# Patient Record
Sex: Female | Born: 1935 | Race: White | Hispanic: No | Smoking: Never smoker
Health system: Southern US, Community
[De-identification: ages and names within clinical notes are randomized; demographics above are authoritative.]

## PROBLEM LIST (undated history)

## (undated) DIAGNOSIS — I4891 Unspecified atrial fibrillation: Secondary | ICD-10-CM

## (undated) DIAGNOSIS — F0390 Unspecified dementia without behavioral disturbance: Secondary | ICD-10-CM

## (undated) DIAGNOSIS — K219 Gastro-esophageal reflux disease without esophagitis: Secondary | ICD-10-CM

---

## 2018-02-26 ENCOUNTER — Ambulatory Visit (INDEPENDENT_AMBULATORY_CARE_PROVIDER_SITE_OTHER): Payer: Medicare Other | Admitting: Podiatry

## 2018-02-26 ENCOUNTER — Encounter: Payer: Self-pay | Admitting: Podiatry

## 2018-02-26 VITALS — BP 150/79 | HR 83

## 2018-02-26 DIAGNOSIS — M79674 Pain in right toe(s): Secondary | ICD-10-CM | POA: Diagnosis not present

## 2018-02-26 DIAGNOSIS — M79675 Pain in left toe(s): Secondary | ICD-10-CM | POA: Diagnosis not present

## 2018-02-26 DIAGNOSIS — B351 Tinea unguium: Secondary | ICD-10-CM | POA: Diagnosis not present

## 2018-02-26 DIAGNOSIS — D689 Coagulation defect, unspecified: Secondary | ICD-10-CM

## 2018-02-26 NOTE — Progress Notes (Signed)
Complaint:  Visit Type: Patient returns to my office for continued preventative foot care services. Complaint: Patient states" my nails have grown long and thick and become painful to walk and wear shoes" Patient is taking eliquiss.. The patient presents for preventative foot care services. No changes to ROS  Podiatric Exam: Vascular: dorsalis pedis are palpable bilateral. Posterior tibial pulses are absent  B/l. Capillary return is immediate. Temperature gradient is WNL. Skin turgor WNL  Sensorium: Normal Semmes Weinstein monofilament test. Normal tactile sensation bilaterally. Nail Exam: Pt has thick disfigured discolored nails with subungual debris noted bilateral entire nail hallux through fifth toenails Ulcer Exam: There is no evidence of ulcer or pre-ulcerative changes or infection. Orthopedic Exam: Muscle tone and strength are WNL. No limitations in general ROM. No crepitus or effusions noted. Foot type and digits show no abnormalities. Bony prominences are unremarkable. Skin: No Porokeratosis. No infection or ulcers  Diagnosis:  Onychomycosis, , Pain in right toe, pain in left toes  Treatment & Plan Procedures and Treatment: Consent by patient was obtained for treatment procedures.   Debridement of mycotic and hypertrophic toenails, 1 through 5 bilateral and clearing of subungual debris. No ulceration, no infection noted.  Return Visit-Office Procedure: Patient instructed to return to the office for a follow up visit 3 months for continued evaluation and treatment.    Helane GuntherGregory Angelina Neece DPM

## 2018-04-13 ENCOUNTER — Ambulatory Visit: Payer: Self-pay | Admitting: Urology

## 2018-04-27 ENCOUNTER — Ambulatory Visit: Payer: Self-pay | Admitting: Urology

## 2018-05-28 ENCOUNTER — Ambulatory Visit (INDEPENDENT_AMBULATORY_CARE_PROVIDER_SITE_OTHER): Payer: Medicare Other | Admitting: Podiatry

## 2018-05-28 ENCOUNTER — Encounter: Payer: Self-pay | Admitting: Podiatry

## 2018-05-28 DIAGNOSIS — M79674 Pain in right toe(s): Secondary | ICD-10-CM | POA: Diagnosis not present

## 2018-05-28 DIAGNOSIS — M79675 Pain in left toe(s): Secondary | ICD-10-CM

## 2018-05-28 DIAGNOSIS — B351 Tinea unguium: Secondary | ICD-10-CM

## 2018-05-28 DIAGNOSIS — D689 Coagulation defect, unspecified: Secondary | ICD-10-CM

## 2018-05-28 NOTE — Progress Notes (Signed)
Complaint:  Visit Type: Patient returns to my office for continued preventative foot care services. Complaint: Patient states" my nails have grown long and thick and become painful to walk and wear shoes" Patient is taking eliquiss.. The patient presents for preventative foot care services. No changes to ROS.  Patient is taking eliquiss.  Podiatric Exam: Vascular: dorsalis pedis are palpable bilateral. Posterior tibial pulses are absent  B/l. Capillary return is immediate. Temperature gradient is WNL. Skin turgor WNL  Sensorium: Normal Semmes Weinstein monofilament test. Normal tactile sensation bilaterally. Nail Exam: Pt has thick disfigured discolored nails with subungual debris noted bilateral entire nail hallux through fifth toenails Ulcer Exam: There is no evidence of ulcer or pre-ulcerative changes or infection. Orthopedic Exam: Muscle tone and strength are WNL. No limitations in general ROM. No crepitus or effusions noted. Foot type and digits show no abnormalities. Bony prominences are unremarkable. Skin: No Porokeratosis. No infection or ulcers  Diagnosis:  Onychomycosis, , Pain in right toe, pain in left toes  Treatment & Plan Procedures and Treatment: Consent by patient was obtained for treatment procedures.   Debridement of mycotic and hypertrophic toenails, 1 through 5 bilateral and clearing of subungual debris. No ulceration, no infection noted.  Return Visit-Office Procedure: Patient instructed to return to the office for a follow up visit 3 months for continued evaluation and treatment.    Helane Gunther DPM

## 2018-08-27 ENCOUNTER — Ambulatory Visit: Payer: Medicare Other | Admitting: Podiatry

## 2018-09-26 ENCOUNTER — Other Ambulatory Visit: Payer: Self-pay

## 2018-09-26 ENCOUNTER — Emergency Department
Admission: EM | Admit: 2018-09-26 | Discharge: 2018-09-26 | Disposition: A | Discharge status: Home / Self Care | Payer: Medicare Other | Attending: Emergency Medicine | Admitting: Emergency Medicine

## 2018-09-26 ENCOUNTER — Encounter: Payer: Self-pay | Admitting: Emergency Medicine

## 2018-09-26 ENCOUNTER — Emergency Department: Payer: Medicare Other

## 2018-09-26 DIAGNOSIS — N3000 Acute cystitis without hematuria: Secondary | ICD-10-CM | POA: Insufficient documentation

## 2018-09-26 DIAGNOSIS — F0390 Unspecified dementia without behavioral disturbance: Secondary | ICD-10-CM | POA: Diagnosis not present

## 2018-09-26 DIAGNOSIS — R55 Syncope and collapse: Secondary | ICD-10-CM | POA: Diagnosis present

## 2018-09-26 DIAGNOSIS — Z79899 Other long term (current) drug therapy: Secondary | ICD-10-CM | POA: Insufficient documentation

## 2018-09-26 DIAGNOSIS — I951 Orthostatic hypotension: Secondary | ICD-10-CM | POA: Diagnosis not present

## 2018-09-26 HISTORY — DX: Unspecified dementia, unspecified severity, without behavioral disturbance, psychotic disturbance, mood disturbance, and anxiety: F03.90

## 2018-09-26 LAB — URINALYSIS, COMPLETE (UACMP) WITH MICROSCOPIC
Bilirubin Urine: NEGATIVE
Glucose, UA: NEGATIVE mg/dL
Hgb urine dipstick: NEGATIVE
Ketones, ur: NEGATIVE mg/dL
Nitrite: NEGATIVE
Protein, ur: NEGATIVE mg/dL
Specific Gravity, Urine: 1.011 (ref 1.005–1.030)
pH: 7 (ref 5.0–8.0)

## 2018-09-26 LAB — CBC
HCT: 47.2 % — ABNORMAL HIGH (ref 36.0–46.0)
Hemoglobin: 14.8 g/dL (ref 12.0–15.0)
MCH: 29.4 pg (ref 26.0–34.0)
MCHC: 31.4 g/dL (ref 30.0–36.0)
MCV: 93.7 fL (ref 80.0–100.0)
Platelets: 219 10*3/uL (ref 150–400)
RBC: 5.04 MIL/uL (ref 3.87–5.11)
RDW: 13.4 % (ref 11.5–15.5)
WBC: 6.7 10*3/uL (ref 4.0–10.5)
nRBC: 0 % (ref 0.0–0.2)

## 2018-09-26 LAB — BASIC METABOLIC PANEL
Anion gap: 12 (ref 5–15)
BUN: 21 mg/dL (ref 8–23)
CO2: 19 mmol/L — ABNORMAL LOW (ref 22–32)
Calcium: 9.3 mg/dL (ref 8.9–10.3)
Chloride: 102 mmol/L (ref 98–111)
Creatinine, Ser: 0.77 mg/dL (ref 0.44–1.00)
GFR calc Af Amer: >60 mL/min (ref >60)
GFR calc non Af Amer: >60 mL/min (ref >60)
Glucose, Bld: 191 mg/dL — ABNORMAL HIGH (ref 70–99)
Potassium: 4.5 mmol/L (ref 3.5–5.1)
Sodium: 133 mmol/L — ABNORMAL LOW (ref 135–145)

## 2018-09-26 MED ORDER — SODIUM CHLORIDE 0.9% FLUSH: 3.0000 mL | Freq: Once | INTRAVENOUS | Status: DC

## 2018-09-26 MED ORDER — FOSFOMYCIN TROMETHAMINE 3 G PO PACK
3.0000 g | PACK | Freq: Once | ORAL | Status: AC
  Administered 2018-09-26: 3 g via ORAL
  Filled 2018-09-26: qty 3

## 2018-09-26 MED ORDER — SODIUM CHLORIDE 0.9 % IV BOLUS
1000.0000 mL | Freq: Once | INTRAVENOUS | Status: AC
  Administered 2018-09-26: 1000 mL via INTRAVENOUS

## 2018-09-26 NOTE — ED Triage Notes (Signed)
Husband reports patient had a syncopal episode in the kitchen this morning, hit head and injured left arm on cabinet.

## 2018-09-26 NOTE — ED Notes (Signed)
PT spouse verbalizes d/c understanding, follow up and change in meds. Pt in NAD, VSS. PT spouse unable to sign due to signature pad malfrx

## 2018-09-26 NOTE — ED Provider Notes (Addendum)
[facility] Emergency Department Provider Note  ____________________________________________   First MD Initiated Contact with Patient 09/26/18 1226     (approximate)  I have reviewed the triage vital signs and the nursing notes.   HISTORY  Chief Complaint Loss of Consciousness    HPI Gabrielle ArenaSarah Navarro is a 85 y.o. female  With h/o dementia here with LOC. History provided by husband due to dementia.  Per his report, pt was in her usual state of health until this morning. Pt was eating, stood up, started walking then passed out. She sat down more so than fell. Husband took BP and it was 104/60 with HR 45.  That 1 of her medications recently changed, and he thinks this was a beta-blocker.  Her heart rate was in the 60s this morning, which is low for her.  She has passed out in the setting of similar episodes in the past.  She is now back to her baseline.  Per husband's report, she has not been ill or unwell.  Her mental status is at her usual.  H/o AFib on Eliquis, BP is usually normal. No recent med changes. H/o similar episodes of syncope in setting of standing up.         Past Medical History:  Diagnosis Date   Dementia (HCC)     There are no active problems to display for this patient.   No past surgical history on file.  Prior to Admission medications   Medication Sig Start Date End Date Taking? Authorizing Provider  fluticasone (FLONASE) 50 MCG/ACT nasal spray Place 2 sprays into the nose daily. 06/07/18 06/07/19 Yes [provider]  losartan (COZAAR) 50 MG tablet Take 50 mg by mouth 2 (two) times a day. 07/02/18  Yes [provider]  memantine (NAMENDA) 5 MG tablet Take 5 mg by mouth 2 (two) times a day. 07/02/18  Yes [provider]  QUEtiapine (SEROQUEL) 25 MG tablet Take 25 mg by mouth every evening. 07/20/18 09/26/18 Yes [provider]  apixaban (ELIQUIS) 2.5 MG TABS tablet Take 2.5 mg by mouth 2 (two) times daily.   05/04/18   [provider]  carvedilol (COREG) 6.25 MG tablet Take 6.25 mg by mouth 2 (two) times daily with a meal.  01/01/18   [provider]  Cyanocobalamin (B-12) 1000 MCG CAPS Take by mouth.    [provider]  levothyroxine (SYNTHROID, LEVOTHROID) 25 MCG tablet Take 25 mcg by mouth daily before breakfast.  02/09/16   [provider]  omeprazole (PRILOSEC) 10 MG capsule Take 10 mg by mouth daily.  04/07/18   [provider]  ranitidine (ZANTAC) 300 MG tablet Take 300 mg by mouth at bedtime.    [provider]  traZODone (DESYREL) 50 MG tablet Take 50 mg by mouth at bedtime.  12/11/17   [provider]    Allergies Amoxicillin, Shellfish allergy, Chocolate flavor, and Sulfa antibiotics  No family history on file.  Social History Social History   Tobacco Use   Smoking status: Never Smoker   Smokeless tobacco: Never Used  Substance Use Topics   Alcohol use: Never    Frequency: Never   Drug use: Never    Review of Systems  Review of Systems  Unable to perform ROS: Dementia  Constitutional: Positive for fatigue. Negative for fever.  HENT: Negative for congestion and sore throat.   Eyes: Negative for visual disturbance.  Respiratory: Negative for cough and shortness of breath.   Cardiovascular:  Negative for chest pain.  Gastrointestinal: Negative for abdominal pain, diarrhea, nausea and vomiting.  Genitourinary: Negative for flank pain.  Musculoskeletal: Negative for back pain and neck pain.  Skin: Negative for rash and wound.  Neurological: Positive for syncope and weakness.  All other systems reviewed and are negative.    ____________________________________________  PHYSICAL EXAM:      VITAL SIGNS: ED Triage Vitals [09/26/18 1157]  Enc Vitals Group     BP (!) 142/84     Pulse Rate 82     Resp 16     Temp 97.7 F (36.5 C)     Temp Source Oral     SpO2 97 %     Weight 115 lb (52.2 kg)     Height       Head Circumference      Peak Flow      Pain Score      Pain Loc      Pain Edu?      Excl. in [place]?      Physical Exam Vitals signs and nursing note reviewed.  Constitutional:      General: She is not in acute distress.    Appearance: She is well-developed.  HENT:     Head: Normocephalic and atraumatic.  Eyes:     Conjunctiva/sclera: Conjunctivae normal.  Neck:     Musculoskeletal: Neck supple.  Cardiovascular:     Rate and Rhythm: Normal rate and regular rhythm.     Heart sounds: Normal heart sounds. No murmur. No friction rub.  Pulmonary:     Effort: Pulmonary effort is normal. No respiratory distress.     Breath sounds: Normal breath sounds. No wheezing or rales.  Abdominal:     General: There is no distension.     Palpations: Abdomen is soft.     Tenderness: There is no abdominal tenderness.  Skin:    General: Skin is warm.     Capillary Refill: Capillary refill takes less than 2 seconds.  Neurological:     Mental Status: She is alert and oriented to person, place, and time.     Motor: No abnormal muscle tone.       ____________________________________________   LABS (all labs ordered are listed, but only abnormal results are displayed)  Labs Reviewed  BASIC METABOLIC PANEL - Abnormal; Notable for the following components:      Result Value   Sodium 133 (*)    CO2 19 (*)    Glucose, Bld 191 (*)    All other components within normal limits  CBC - Abnormal; Notable for the following components:   HCT 47.2 (*)    All other components within normal limits  URINALYSIS, COMPLETE (UACMP) WITH MICROSCOPIC - Abnormal; Notable for the following components:   Color, Urine YELLOW (*)    APPearance CLEAR (*)    Leukocytes,Ua MODERATE (*)    Bacteria, UA RARE (*)    All other components within normal limits  URINE CULTURE  CBG MONITORING, ED    ____________________________________________  EKG: Atrial fibrillation, ventricular rate 80.  QRS 73, QTc 426.  No  apparent acute ischemic changes. ________________________________________  RADIOLOGY All imaging, including plain films, CT scans, and ultrasounds, independently reviewed by me, and interpretations confirmed via formal radiology reads.  ED MD interpretation:   Chest x-ray: No acute abnormality CT head: No acute intracranial abnormality CT cervical spine: No acute abnormality  Official radiology report(s): Dg Chest 2 View  Result Date: 09/26/2018 CLINICAL DATA:  Syncope this morning. EXAM: CHEST - 2 VIEW COMPARISON:  None. FINDINGS: The lungs are clear. There is marked cardiomegaly. No pneumothorax or pleural effusion. Atherosclerosis noted. No acute or focal bony abnormality. IMPRESSION: No acute disease. Cardiomegaly. Atherosclerosis. Electronically Signed   By: Drusilla Kannerhomas  Dalessio M.D.   On: 09/26/2018 13:06   Ct Head Wo Contrast  Result Date: 09/26/2018 CLINICAL DATA:  Syncope, head injury after fall. EXAM: CT HEAD WITHOUT CONTRAST CT CERVICAL SPINE WITHOUT CONTRAST TECHNIQUE: Multidetector CT imaging of the head and cervical spine was performed following the standard protocol without intravenous contrast. Multiplanar CT image reconstructions of the cervical spine were also generated. COMPARISON:  None. FINDINGS: CT HEAD FINDINGS Brain: Left occipital encephalomalacia is noted consistent with old infarction. Mild chronic ischemic white matter disease is noted. Old right cerebellar infarction is noted. No mass effect or midline shift is noted. Ventricular size is within normal limits. There is no evidence of mass lesion, hemorrhage or acute infarction. Vascular: No hyperdense vessel or unexpected calcification. Skull: Normal. Negative for fracture or focal lesion. Sinuses/Orbits: No acute finding. Other: None. CT CERVICAL SPINE FINDINGS Alignment: Minimal retrolisthesis of C5-6 is noted secondary to moderate degenerative disc disease at this level. Skull base and vertebrae: No acute fracture. No  primary bone lesion or focal pathologic process. Soft tissues and spinal canal: No prevertebral fluid or swelling. No visible canal hematoma. Disc levels: Moderate degenerative disc disease is noted at C3-4, C4-5 and C5-6. Upper chest: Negative. Other: Degenerative changes are seen involving posterior facet joints bilaterally. IMPRESSION: Old left occipital infarction. No acute intracranial abnormality seen. Multilevel degenerative disc disease. No acute abnormality seen in the cervical spine. Electronically Signed   By: Lupita RaiderJames  Green Jr M.D.   On: 09/26/2018 13:24   Ct Cervical Spine Wo Contrast  Result Date: 09/26/2018 CLINICAL DATA:  Syncope, head injury after fall. EXAM: CT HEAD WITHOUT CONTRAST CT CERVICAL SPINE WITHOUT CONTRAST TECHNIQUE: Multidetector CT imaging of the head and cervical spine was performed following the standard protocol without intravenous contrast. Multiplanar CT image reconstructions of the cervical spine were also generated. COMPARISON:  None. FINDINGS: CT HEAD FINDINGS Brain: Left occipital encephalomalacia is noted consistent with old infarction. Mild chronic ischemic white matter disease is noted. Old right cerebellar infarction is noted. No mass effect or midline shift is noted. Ventricular size is within normal limits. There is no evidence of mass lesion, hemorrhage or acute infarction. Vascular: No hyperdense vessel or unexpected calcification. Skull: Normal. Negative for fracture or focal lesion. Sinuses/Orbits: No acute finding. Other: None. CT CERVICAL SPINE FINDINGS Alignment: Minimal retrolisthesis of C5-6 is noted secondary to moderate degenerative disc disease at this level. Skull base and vertebrae: No acute fracture. No primary bone lesion or focal pathologic process. Soft tissues and spinal canal: No prevertebral fluid or swelling. No visible canal hematoma. Disc levels: Moderate degenerative disc disease is noted at C3-4, C4-5 and C5-6. Upper chest: Negative. Other:  Degenerative changes are seen involving posterior facet joints bilaterally. IMPRESSION: Old left occipital infarction. No acute intracranial abnormality seen. Multilevel degenerative disc disease. No acute abnormality seen in the cervical spine. Electronically Signed   By: Lupita RaiderJames  Green Jr M.D.   On: 09/26/2018 13:24    ____________________________________________  PROCEDURES   Procedure(s) performed (including Critical Care):  Procedures  ____________________________________________  INITIAL IMPRESSION / MDM / ASSESSMENT AND PLAN / ED COURSE  As part of my medical decision making, I reviewed the following data within the electronic MEDICAL RECORD NUMBER Notes from  prior ED visits and [place] Controlled Substance Database      *Gabrielle ArenaSarah Navarro was evaluated in Emergency Department on 09/26/2018 for the symptoms described in the history of present illness. She was evaluated in the context of the global COVID-19 pandemic, which necessitated consideration that the patient might be at risk for infection with the SARS-CoV-2 virus that causes COVID-19. Institutional protocols and algorithms that pertain to the evaluation of patients at risk for COVID-19 are in a state of rapid change based on information released by regulatory bodies including the CDC and federal and state organizations. These policies and algorithms were followed during the patient's care in the ED.  Some ED evaluations and interventions may be delayed as a result of limited staffing during the pandemic.*   Clinical Course as of Sep 25 1505  Wed Sep 26, 2018  1416 Discussed plan with daughter, [name], who lives nearby. Also discussed plan at length with husband.   [CI]    Clinical Course User Index [CI] Shaune PollackIsaacs, Henley Boettner, MD    Medical Decision Making: 85 year old female here with syncope.  This is a chronic, recurring issue for which she has seen cardiology and neurology.  Today, the seem to be correlated with standing up, and per her  report and husband's report, this seems to have gotten worse after starting Seroquel nightly.  She also took her carvedilol prior to eating.  I suspect orthostasis secondary to combination of age, Seroquel, and likely lack of appropriate compensatory response due to beta-blockade.  She is not anemic here.  She appears mildly dehydrated and has been given fluids but lab work is otherwise reassuring.  EKG is nonischemic without arrhythmia.  Her heart rate is now in the 70s to 80s, which is her baseline.  She is been ambulatory without difficulty.  CT and plain films are negative.  I had a long discussion with the husband as well as her daughter.  Given her age and dementia, they would like to attempt outpatient management which I think is very reasonable.  Will have her hold her Seroquel temporarily, also have her dose of Coreg for the next several days to see if this improves her symptoms.  Would recommend calling her cardiologist and PCP for seen in the morning.  Daughter understands and is in agreement.  Of note, urinalysis incidentally shows possible UTI.  This is a chronic issue for her.  Given that she is otherwise well-appearing, the low concern for systemic illness due to this.  Will give a dose of fosfomycin given her complex history, but do not feel this is complicated anyway.  No signs upon nephritis.  Urine culture sent.  ____________________________________________  FINAL CLINICAL IMPRESSION(S) / ED DIAGNOSES  Final diagnoses:  Orthostasis  Polypharmacy  Acute cystitis without hematuria     MEDICATIONS GIVEN DURING THIS VISIT:  Medications  sodium chloride flush (NS) 0.9 % injection 3 mL (has no administration in time range)  fosfomycin (MONUROL) packet 3 g (has no administration in time range)  sodium chloride 0.9 % bolus 1,000 mL (1,000 mLs Intravenous New Bag/Given 09/26/18 1321)     ED Discharge Orders    None       Note:  This document was prepared using Dragon voice  recognition software and may include unintentional dictation errors.   Shaune PollackIsaacs, Vernona Peake, MD 09/26/18 1450    Shaune PollackIsaacs, Crawford Tamura, MD 09/26/18 Chyrel [id number]    Shaune PollackIsaacs, Saryah Loper, MD 09/26/18 [phone]

## 2018-09-26 NOTE — ED Notes (Signed)
Patient transported to CT 

## 2018-09-26 NOTE — Discharge Instructions (Addendum)
I suspect your symptoms today are likely due to medication effects in addition to mild dehydration.  You have been given fluids for her dehydration.  Make sure you drink plenty of fluids at home.  For now, we will recommend the following medication changes: - STOP taking the Seroquel at night, as this can cause drop in blood pressure. Call Dr. Manuella Ghazi to discuss. - FOR THE NEXT 2-3 DAYS, only take HALF of your Carvedilol dose. Instead of 6.25 mg twice daily, take one half of this dose for a total of 3.125 mg twice daily. If your blood pressure and heart rate are high, you can resume your normal dose but I'd recommend first increasing the night dose to the full 6.25 mg for one or two days, then going back to your old dose.  - Drink plenty of fluids and try to eat frequent small meals

## 2019-09-06 ENCOUNTER — Other Ambulatory Visit: Payer: Self-pay

## 2019-09-06 ENCOUNTER — Emergency Department: Payer: Medicare Other

## 2019-09-06 ENCOUNTER — Observation Stay
Admission: EM | Admit: 2019-09-06 | Discharge: 2019-09-07 | Disposition: A | Discharge status: Skilled Nursing Facility | Payer: Medicare Other | Attending: Internal Medicine | Admitting: Internal Medicine

## 2019-09-06 DIAGNOSIS — E039 Hypothyroidism, unspecified: Secondary | ICD-10-CM | POA: Diagnosis not present

## 2019-09-06 DIAGNOSIS — Z7901 Long term (current) use of anticoagulants: Secondary | ICD-10-CM | POA: Diagnosis not present

## 2019-09-06 DIAGNOSIS — F0390 Unspecified dementia without behavioral disturbance: Secondary | ICD-10-CM | POA: Diagnosis not present

## 2019-09-06 DIAGNOSIS — Z8673 Personal history of transient ischemic attack (TIA), and cerebral infarction without residual deficits: Secondary | ICD-10-CM | POA: Insufficient documentation

## 2019-09-06 DIAGNOSIS — I10 Essential (primary) hypertension: Secondary | ICD-10-CM | POA: Diagnosis not present

## 2019-09-06 DIAGNOSIS — R55 Syncope and collapse: Principal | ICD-10-CM | POA: Insufficient documentation

## 2019-09-06 DIAGNOSIS — H9190 Unspecified hearing loss, unspecified ear: Secondary | ICD-10-CM

## 2019-09-06 DIAGNOSIS — Z882 Allergy status to sulfonamides status: Secondary | ICD-10-CM | POA: Insufficient documentation

## 2019-09-06 DIAGNOSIS — Z79890 Hormone replacement therapy (postmenopausal): Secondary | ICD-10-CM | POA: Insufficient documentation

## 2019-09-06 DIAGNOSIS — Z20822 Contact with and (suspected) exposure to covid-19: Secondary | ICD-10-CM | POA: Diagnosis not present

## 2019-09-06 DIAGNOSIS — K219 Gastro-esophageal reflux disease without esophagitis: Secondary | ICD-10-CM | POA: Diagnosis not present

## 2019-09-06 DIAGNOSIS — I4891 Unspecified atrial fibrillation: Secondary | ICD-10-CM | POA: Insufficient documentation

## 2019-09-06 DIAGNOSIS — Z79899 Other long term (current) drug therapy: Secondary | ICD-10-CM | POA: Insufficient documentation

## 2019-09-06 DIAGNOSIS — Z880 Allergy status to penicillin: Secondary | ICD-10-CM | POA: Diagnosis not present

## 2019-09-06 DIAGNOSIS — I482 Chronic atrial fibrillation, unspecified: Secondary | ICD-10-CM | POA: Diagnosis present

## 2019-09-06 HISTORY — DX: Unspecified atrial fibrillation: I48.91

## 2019-09-06 HISTORY — DX: Gastro-esophageal reflux disease without esophagitis: K21.9

## 2019-09-06 LAB — COMPREHENSIVE METABOLIC PANEL
ALT: 13 U/L (ref 0–44)
AST: 27 U/L (ref 15–41)
Albumin: 3.4 g/dL — ABNORMAL LOW (ref 3.5–5.0)
Alkaline Phosphatase: 64 U/L (ref 38–126)
Anion gap: 6 (ref 5–15)
BUN: 27 mg/dL — ABNORMAL HIGH (ref 8–23)
CO2: 25 mmol/L (ref 22–32)
Calcium: 8.5 mg/dL — ABNORMAL LOW (ref 8.9–10.3)
Chloride: 105 mmol/L (ref 98–111)
Creatinine, Ser: 0.81 mg/dL (ref 0.44–1.00)
GFR calc Af Amer: >60 mL/min (ref >60)
GFR calc non Af Amer: >60 mL/min (ref >60)
Glucose, Bld: 130 mg/dL — ABNORMAL HIGH (ref 70–99)
Potassium: 4.4 mmol/L (ref 3.5–5.1)
Sodium: 136 mmol/L (ref 135–145)
Total Bilirubin: 1 mg/dL (ref 0.3–1.2)
Total Protein: 6.3 g/dL — ABNORMAL LOW (ref 6.5–8.1)

## 2019-09-06 LAB — URINALYSIS, COMPLETE (UACMP) WITH MICROSCOPIC
Bacteria, UA: NONE SEEN
Bilirubin Urine: NEGATIVE
Glucose, UA: NEGATIVE mg/dL
Hgb urine dipstick: NEGATIVE
Ketones, ur: NEGATIVE mg/dL
Nitrite: NEGATIVE
Protein, ur: NEGATIVE mg/dL
Specific Gravity, Urine: 1.018 (ref 1.005–1.030)
pH: 6 (ref 5.0–8.0)

## 2019-09-06 LAB — CBC WITH DIFFERENTIAL/PLATELET
Abs Immature Granulocytes: 0.02 10*3/uL (ref 0.00–0.07)
Basophils Absolute: 0 10*3/uL (ref 0.0–0.1)
Basophils Relative: 1 %
Eosinophils Absolute: 0.2 10*3/uL (ref 0.0–0.5)
Eosinophils Relative: 3 %
HCT: 40 % (ref 36.0–46.0)
Hemoglobin: 13 g/dL (ref 12.0–15.0)
Immature Granulocytes: 0 %
Lymphocytes Relative: 27 %
Lymphs Abs: 1.3 10*3/uL (ref 0.7–4.0)
MCH: 31.4 pg (ref 26.0–34.0)
MCHC: 32.5 g/dL (ref 30.0–36.0)
MCV: 96.6 fL (ref 80.0–100.0)
Monocytes Absolute: 0.6 10*3/uL (ref 0.1–1.0)
Monocytes Relative: 12 %
Neutro Abs: 2.8 10*3/uL (ref 1.7–7.7)
Neutrophils Relative %: 57 %
Platelets: 189 10*3/uL (ref 150–400)
RBC: 4.14 MIL/uL (ref 3.87–5.11)
RDW: 13.2 % (ref 11.5–15.5)
WBC: 5 10*3/uL (ref 4.0–10.5)
nRBC: 0 % (ref 0.0–0.2)

## 2019-09-06 LAB — TROPONIN I (HIGH SENSITIVITY)
Troponin I (High Sensitivity): 5 ng/L (ref <18)
Troponin I (High Sensitivity): 5 ng/L (ref <18)

## 2019-09-06 LAB — LIPASE, BLOOD: Lipase: 49 U/L (ref 11–51)

## 2019-09-06 NOTE — ED Notes (Signed)
dAUGHTER AT [facility] WHO STATES SHE WILL COME TALK TO FAMILY

## 2019-09-06 NOTE — ED Triage Notes (Signed)
PT to ED via EMS from [facility]. PT was outside with family, went to stand up and pt lost consciousness. Caught by family. Did not hit head or fall. Had been "sluggish" all day per family and that is not her normal. 2 episodes of vomiting. 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   4mg  zofran NS  cbg 138

## 2019-09-06 NOTE — ED Notes (Signed)
PT assisted to restroom

## 2019-09-06 NOTE — ED Provider Notes (Signed)
[facility] Emergency Department Provider Note  ____________________________________________   First MD Initiated Contact with Patient 09/06/19 1930     (approximate)  I have reviewed the triage vital signs and the nursing notes.   HISTORY  Chief Complaint Loss of Consciousness    HPI Gabrielle Navarro is a 86 y.o. female with dementia who comes in with LOC.  Patient is very hard of hearing unable to get a full H&P from patient due to her baseline dementia.  According to family, so after she had eating, she had slurred speech and greyish color. They thought she was more sleepy.  Then she stood up and starting to sit down and had LOC for a few minutes and then when she came too she started having vomiting of what she ate.  She then had another LOC episode.  No seizure activity. Now her color is better and speech seems intact. She has had several mini strokes and had carotid surgery a few years ago.           Past Medical History:  Diagnosis Date  . Dementia (HCC)     There are no problems to display for this patient.   No past surgical history on file.  Prior to Admission medications   Medication Sig Start Date End Date Taking? Authorizing Provider  apixaban (ELIQUIS) 2.5 MG TABS tablet Take 2.5 mg by mouth 2 (two) times daily.  05/04/18   [provider]  carvedilol (COREG) 6.25 MG tablet Take 6.25 mg by mouth 2 (two) times daily with a meal.  01/01/18   [provider]  Cyanocobalamin (B-12) 1000 MCG CAPS Take by mouth.    [provider]  fluticasone (FLONASE) 50 MCG/ACT nasal spray Place 2 sprays into the nose daily. 06/07/18 06/07/19  [provider]  levothyroxine (SYNTHROID, LEVOTHROID) 25 MCG tablet Take 25 mcg by mouth daily before breakfast.  02/09/16   [provider]  losartan (COZAAR) 50 MG tablet Take 50 mg by mouth 2 (two) times a day. 07/02/18   [provider]  memantine (NAMENDA) 5 MG  tablet Take 5 mg by mouth 2 (two) times a day. 07/02/18   [provider]  omeprazole (PRILOSEC) 10 MG capsule Take 10 mg by mouth daily.  04/07/18   [provider]  ranitidine (ZANTAC) 300 MG tablet Take 300 mg by mouth at bedtime.    [provider]  traZODone (DESYREL) 50 MG tablet Take 50 mg by mouth at bedtime.  12/11/17   [provider]  QUEtiapine (SEROQUEL) 25 MG tablet Take 25 mg by mouth every evening. 07/20/18 09/26/18  [provider]    Allergies Amoxicillin, Shellfish allergy, Chocolate flavor, and Sulfa antibiotics  No family history on file.  Social History Social History   Tobacco Use  . Smoking status: Never Smoker  . Smokeless tobacco: Never Used  Substance Use Topics  . Alcohol use: Never  . Drug use: Never      Review of Systems Unable to get full review of systems due to patient's baseline dementia ____________________________________________   PHYSICAL EXAM:  VITAL SIGNS: ED Triage Vitals [09/06/19 1932]  Enc Vitals Group     BP      Pulse      Resp      Temp      Temp src      SpO2      Weight 138 lb (62.6 kg)     Height 5\' 5"  (1.651  m)     Head Circumference      Peak Flow      Pain Score 0     Pain Loc      Pain Edu?      Excl. in [place]?     Constitutional: Very hard of hearing so difficult to get history from patient Eyes: Conjunctivae are normal. EOMI. irregular pupil on the left baseline per family Head: Atraumatic. Nose: No congestion/rhinnorhea. Mouth/Throat: Mucous membranes are moist.   Neck: No stridor. Trachea Midline. FROM Cardiovascular: Normal rate, regular rhythm. Grossly normal heart sounds.  Good peripheral circulation. Respiratory: Normal respiratory effort.  No retractions. Lungs CTAB. Gastrointestinal: Soft and nontender. No distention. No abdominal bruits.  Musculoskeletal: No lower extremity tenderness nor edema.  No joint effusions. Neurologic: Cranial nerves appear intact  except for an irregular left pupil which is baseline.  Equal strength in arms and legs Skin:  Skin is warm, dry and intact. No rash noted. Psychiatric: Mood and affect are normal. Speech and behavior are normal. GU: Deferred   ____________________________________________   LABS (all labs ordered are listed, but only abnormal results are displayed)  Labs Reviewed  COMPREHENSIVE METABOLIC PANEL - Abnormal; Notable for the following components:      Result Value   Glucose, Bld 130 (*)    BUN 27 (*)    Calcium 8.5 (*)    Total Protein 6.3 (*)    Albumin 3.4 (*)    All other components within normal limits  URINALYSIS, COMPLETE (UACMP) WITH MICROSCOPIC - Abnormal; Notable for the following components:   Color, Urine YELLOW (*)    APPearance HAZY (*)    Leukocytes,Ua MODERATE (*)    All other components within normal limits  URINE CULTURE  CBC WITH DIFFERENTIAL/PLATELET  LIPASE, BLOOD  TROPONIN I (HIGH SENSITIVITY)  TROPONIN I (HIGH SENSITIVITY)   ____________________________________________   ED ECG REPORT I, Vanessa [place], the attending physician, personally viewed and interpreted this ECG.  Patient with A fibrillation rate of 79, no ST elevation, no T wave inversions, normal intervals ____________________________________________  RADIOLOGY  Official radiology report(s): CT Head Wo Contrast  Result Date: 09/06/2019 CLINICAL DATA:  Loss consciousness upon standing from seated position, no fall or head strike EXAM: CT HEAD WITHOUT CONTRAST TECHNIQUE: Contiguous axial images were obtained from the base of the skull through the vertex without intravenous contrast. COMPARISON:  CT 09/26/2018 FINDINGS: Brain: There stable regions of encephalomalacia involving the left occipital lobe and right cerebellum. No evidence of acute infarction, hemorrhage, hydrocephalus, extra-axial collection or mass lesion/mass effect. Symmetric prominence of the ventricles, cisterns and sulci compatible  with parenchymal volume loss. Patchy areas of white matter hypoattenuation are most compatible with chronic microvascular angiopathy. Vascular: Atherosclerotic calcification of the carotid siphons and intradural vertebral arteries. No hyperdense vessel. Skull: No calvarial fracture or suspicious osseous lesion. No scalp swelling or hematoma. Sinuses/Orbits: Paranasal sinuses and mastoid air cells are predominantly clear. Orbital structures are unremarkable aside from prior lens extractions. Other: Few benign dermal calcifications similar to prior. Mild bilateral TMJ arthrosis. IMPRESSION: 1. No acute intracranial abnormality. 2. Stable regions of encephalomalacia involving the left occipital lobe and right cerebellum. 3. Stable parenchymal volume loss and chronic microvascular angiopathy. Electronically Signed   By: Lovena Le M.D.   On: 09/06/2019 22:15    ____________________________________________   PROCEDURES  Procedure(s) performed (including Critical Care):  Procedures   ____________________________________________   INITIAL IMPRESSION / ASSESSMENT AND PLAN / ED COURSE  Gabrielle Navarro was  evaluated in Emergency Department on 09/06/2019 for the symptoms described in the history of present illness. She was evaluated in the context of the global COVID-19 pandemic, which necessitated consideration that the patient might be at risk for infection with the SARS-CoV-2 virus that causes COVID-19. Institutional protocols and algorithms that pertain to the evaluation of patients at risk for COVID-19 are in a state of rapid change based on information released by regulatory bodies including the CDC and federal and state organizations. These policies and algorithms were followed during the patient's care in the ED.    Patient is an 86 year old with A. fib on Eliquis who comes in with syncopal episode possible some aphasia prior to the event.  Patient seem to have a normal neuro exam at this time.   Possible TIA.  Not hit her head but given the concern for TIA will get CT head evaluate for intracranial hemorrhage.  Will get labs to evaluate [place]ra, AKI, UTI  Labs are reassuring.  UA negative.  CT head negative.  After discussion with the daughter they felt more comfortable admitting patient for syncopal work-up.       ____________________________________________   FINAL CLINICAL IMPRESSION(S) / ED DIAGNOSES   Final diagnoses:  Syncope, unspecified syncope type      MEDICATIONS GIVEN DURING THIS VISIT:  Medications - No data to display   ED Discharge Orders    None       Note:  This document was prepared using Dragon voice recognition software and may include unintentional dictation errors.   Concha Se, MD 09/07/19 [phone]

## 2019-09-06 NOTE — H&P (Signed)
History and Physical   Gabrielle Navarro DGL:[id number] DOB: 03/26/1927 DOA: 09/06/2019  Referring MD/NP/PA: Dr. Fuller Plan  PCP: Patient, No Pcp Per   Outpatient Specialists: None  Patient coming from: Home  Chief Complaint: Passing out  HPI: Gabrielle Navarro is a 86 y.o. female with medical history significant of atrial fibrillation on chronic anticoagulation, dementia, hard of hearing, GERD, benign essential hypertension, hypothyroidism who was apparently out with her daughter today after eating she sat down and had loss of consciousness for a few minutes.  Patient came to and they were thinking she was having some sleepiness.  She stood up again started having vomiting and then passed out again.  There was no tonic-clonic activity no loss of bladder consciousness.  Patient has since returned back to baseline.  She was brought in for evaluation of the symptoms of syncope.  She denied any palpitations no chest pain.  Denied any shortness of breath or cough.  Patient has remote history of carotid artery stenosis with some surgery.  At this point she is being admitted for observation and evaluation of syncope..  ED Course: Temperature 93 blood pressure 150/86 pulse 83 respirate of 18 her oxygen sat 97% room air.  CBC and chemistry largely within normal except for BUN of 27 creatinine 0.81.  Glucose 130.  Initial head CT without contrast is negative.  Urinalysis also negative.  Patient being admitted for syncopal work-up.  Review of Systems: As per HPI otherwise 10 point review of systems negative.    Past Medical History:  Diagnosis Date  . Atrial fibrillation (HCC)   . Dementia (HCC)   . GERD (gastroesophageal reflux disease)     History reviewed. No pertinent surgical history.   reports that she has never smoked. She has never used smokeless tobacco. She reports that she does not drink alcohol and does not use drugs.  Allergies  Allergen Reactions  . Amoxicillin Anaphylaxis    Patient has tolerated  cefdinir within the last year.  See full allergy assessment progress note from 10/9  . Shellfish Allergy Anaphylaxis  . Chocolate Flavor Hives  . Sulfa Antibiotics Other (See Comments)    No family history on file.   Prior to Admission medications   Medication Sig Start Date End Date Taking? Authorizing Provider  apixaban (ELIQUIS) 2.5 MG TABS tablet Take 2.5 mg by mouth 2 (two) times daily.  05/04/18   [provider]  carvedilol (COREG) 6.25 MG tablet Take 6.25 mg by mouth 2 (two) times daily with a meal.  01/01/18   [provider]  Cyanocobalamin (B-12) 1000 MCG CAPS Take by mouth.    [provider]  fluticasone (FLONASE) 50 MCG/ACT nasal spray Place 2 sprays into the nose daily. 06/07/18 06/07/19  [provider]  levothyroxine (SYNTHROID, LEVOTHROID) 25 MCG tablet Take 25 mcg by mouth daily before breakfast.  02/09/16   [provider]  losartan (COZAAR) 50 MG tablet Take 50 mg by mouth 2 (two) times a day. 07/02/18   [provider]  memantine (NAMENDA) 5 MG tablet Take 5 mg by mouth 2 (two) times a day. 07/02/18   [provider]  omeprazole (PRILOSEC) 10 MG capsule Take 10 mg by mouth daily.  04/07/18   [provider]  ranitidine (ZANTAC) 300 MG tablet Take 300 mg by mouth at bedtime.    [provider]  traZODone (DESYREL) 50 MG tablet Take 50 mg by mouth at bedtime.  12/11/17   [provider]  QUEtiapine (  SEROQUEL) 25 MG tablet Take 25 mg by mouth every evening. 07/20/18 09/26/18  [provider]    Physical Exam: Vitals:   09/06/19 1932 09/06/19 1937 09/06/19 2225  BP:  (!) 158/86 139/76  Pulse:  79 83  Resp:  18 16  Temp:  98.3 F (36.8 C)   TempSrc:  Oral   SpO2:  99% 97%  Weight: 62.6 kg    Height: 5\' 5"  (1.651 m)        Constitutional: NAD, calm, comfortable Vitals:   09/06/19 1932 09/06/19 1937 09/06/19 2225  BP:  (!) 158/86 139/76  Pulse:  79 83  Resp:  18 16    Temp:  98.3 F (36.8 C)   TempSrc:  Oral   SpO2:  99% 97%  Weight: 62.6 kg    Height: 5\' 5"  (1.651 m)     Eyes: PERRL, lids and conjunctivae normal ENMT: Mucous membranes are moist. Posterior pharynx clear of any exudate or lesions.Normal dentition.  Neck: normal, supple, no masses, no thyromegaly Respiratory: clear to auscultation bilaterally, no wheezing, no crackles. Normal respiratory effort. No accessory muscle use.  Cardiovascular: Regular rate and rhythm, no murmurs / rubs / gallops. No extremity edema. 2+ pedal pulses. No carotid bruits.  Abdomen: no tenderness, no masses palpated. No hepatosplenomegaly. Bowel sounds positive.  Musculoskeletal: no clubbing / cyanosis. No joint deformity upper and lower extremities. Good ROM, no contractures. Normal muscle tone.  Skin: no rashes, lesions, ulcers. No induration Neurologic: CN 2-12 grossly intact. Sensation intact, DTR normal. Strength 5/5 in all 4.  Psychiatric: Normal judgment and insight. Alert and oriented x 3. Normal mood.     Labs on Admission: I have personally reviewed following labs and imaging studies  CBC: Recent Labs  Lab 09/06/19 1942  WBC 5.0  NEUTROABS 2.8  HGB 13.0  HCT 40.0  MCV 96.6  PLT 387   Basic Metabolic Panel: Recent Labs  Lab 09/06/19 1942  NA 136  K 4.4  CL 105  CO2 25  GLUCOSE 130*  BUN 27*  CREATININE 0.81  CALCIUM 8.5*   GFR: Estimated Creatinine Clearance: 42.4 mL/min (by C-G formula based on SCr of 0.81 mg/dL). Liver Function Tests: Recent Labs  Lab 09/06/19 1942  AST 27  ALT 13  ALKPHOS 64  BILITOT 1.0  PROT 6.3*  ALBUMIN 3.4*   Recent Labs  Lab 09/06/19 1942  LIPASE 49   No results for input(s): AMMONIA in the last 168 hours. Coagulation Profile: No results for input(s): INR, PROTIME in the last 168 hours. Cardiac Enzymes: No results for input(s): CKTOTAL, CKMB, CKMBINDEX, TROPONINI in the last 168 hours. BNP (last 3 results) No results for input(s): PROBNP  in the last 8760 hours. HbA1C: No results for input(s): HGBA1C in the last 72 hours. CBG: No results for input(s): GLUCAP in the last 168 hours. Lipid Profile: No results for input(s): CHOL, HDL, LDLCALC, TRIG, CHOLHDL, LDLDIRECT in the last 72 hours. Thyroid Function Tests: No results for input(s): TSH, T4TOTAL, FREET4, T3FREE, THYROIDAB in the last 72 hours. Anemia Panel: No results for input(s): VITAMINB12, FOLATE, FERRITIN, TIBC, IRON, RETICCTPCT in the last 72 hours. Urine analysis:    Component Value Date/Time   COLORURINE YELLOW (A) 09/06/2019 1942   APPEARANCEUR HAZY (A) 09/06/2019 1942   LABSPEC 1.018 09/06/2019 1942   PHURINE 6.0 09/06/2019 1942   GLUCOSEU NEGATIVE 09/06/2019 1942   HGBUR NEGATIVE 09/06/2019 1942   BILIRUBINUR NEGATIVE 09/06/2019 1942   KETONESUR NEGATIVE 09/06/2019 1942  PROTEINUR NEGATIVE 09/06/2019 1942   NITRITE NEGATIVE 09/06/2019 1942   LEUKOCYTESUR MODERATE (A) 09/06/2019 1942   Sepsis Labs: @LABRCNTIP (procalcitonin:4,lacticidven:4) )No results found for this or any previous visit (from the past 240 hour(s)).   Radiological Exams on Admission: CT Head Wo Contrast  Result Date: 09/06/2019 CLINICAL DATA:  Loss consciousness upon standing from seated position, no fall or head strike EXAM: CT HEAD WITHOUT CONTRAST TECHNIQUE: Contiguous axial images were obtained from the base of the skull through the vertex without intravenous contrast. COMPARISON:  CT 09/26/2018 FINDINGS: Brain: There stable regions of encephalomalacia involving the left occipital lobe and right cerebellum. No evidence of acute infarction, hemorrhage, hydrocephalus, extra-axial collection or mass lesion/mass effect. Symmetric prominence of the ventricles, cisterns and sulci compatible with parenchymal volume loss. Patchy areas of white matter hypoattenuation are most compatible with chronic microvascular angiopathy. Vascular: Atherosclerotic calcification of the carotid siphons and  intradural vertebral arteries. No hyperdense vessel. Skull: No calvarial fracture or suspicious osseous lesion. No scalp swelling or hematoma. Sinuses/Orbits: Paranasal sinuses and mastoid air cells are predominantly clear. Orbital structures are unremarkable aside from prior lens extractions. Other: Few benign dermal calcifications similar to prior. Mild bilateral TMJ arthrosis. IMPRESSION: 1. No acute intracranial abnormality. 2. Stable regions of encephalomalacia involving the left occipital lobe and right cerebellum. 3. Stable parenchymal volume loss and chronic microvascular angiopathy. Electronically Signed   By: 11/27/2018 M.D.   On: 09/06/2019 22:15    EKG: Independently reviewed.  It shows atrial fibrillation with a rate of 79, no significant changes.  Normal voltage.  Assessment/Plan Principal Problem:   Syncope and collapse Active Problems:   A-fib (HCC)   Dementia (HCC)   HOH (hard of hearing)   GERD (gastroesophageal reflux disease)   Benign essential HTN   Hypothyroidism     #1 syncope: Possibly secondary to arrhythmias.  She has baseline history of A. fib.  Dehydration is possible especially with her BUN a little elevated.  Patient could also have had vasovagal syncope or syncope as a result of her history of carotid disease.  She will be admitted.  Gentle hydration.  Carotid ultrasound as well as echocardiogram will be done.  Evaluate with PT and OT.  Disposition will be based on results.  #2 dementia: At baseline.  Continue monitoring.  3.  A. fib: Rate is controlled.  Appears to be on Eliquis.  We will continue with that.  4.  GERD: Continue with PPIs.  5.  Benign essential hypertension: Continue home regimen for blood pressure control.  6.  Hypothyroidism: Continue levothyroxine.   DVT prophylaxis: Eliquis Code Status: Full  Family Communication: Daughter at bedside  Disposition Plan: Home  Consults called: None  Admission status: Observation   Severity of  Illness: The appropriate patient status for this patient is OBSERVATION. Observation status is judged to be reasonable and necessary in order to provide the required intensity of service to ensure the patient's safety. The patient's presenting symptoms, physical exam findings, and initial radiographic and laboratory data in the context of their medical condition is felt to place them at decreased risk for further clinical deterioration. Furthermore, it is anticipated that the patient will be medically stable for discharge from the hospital within 2 midnights of admission. The following factors support the patient status of observation.   " The patient's presenting symptoms include Syncope. " The physical exam findings include None. " The initial radiographic and laboratory data are Negative.     09/08/2019 MD [facility]  Pager [phone]  If 7PM-7AM, please contact night-coverage [email or web address] Password [email or web address]  09/06/2019, 10:50 PM

## 2019-09-07 ENCOUNTER — Observation Stay: Payer: Medicare Other

## 2019-09-07 DIAGNOSIS — R55 Syncope and collapse: Secondary | ICD-10-CM

## 2019-09-07 LAB — GLUCOSE, CAPILLARY: Glucose-Capillary: 106 mg/dL — ABNORMAL HIGH (ref 70–99)

## 2019-09-07 LAB — TSH: TSH: 1.398 u[IU]/mL (ref 0.350–4.500)

## 2019-09-07 LAB — SARS CORONAVIRUS 2 BY RT PCR (HOSPITAL ORDER, PERFORMED IN ~~LOC~~ HOSPITAL LAB): SARS Coronavirus 2: NEGATIVE

## 2019-09-07 MED ORDER — ONDANSETRON HCL 4 MG/2ML IJ SOLN: 4.0000 mg | Freq: Four times a day (QID) | INTRAMUSCULAR | Status: DC | PRN

## 2019-09-07 MED ORDER — ACETAMINOPHEN 650 MG RE SUPP: 650.0000 mg | Freq: Four times a day (QID) | RECTAL | Status: DC | PRN

## 2019-09-07 MED ORDER — APIXABAN 2.5 MG PO TABS
2.5000 mg | ORAL_TABLET | Freq: Two times a day (BID) | ORAL | Status: DC
  Administered 2019-09-07: 10:00:00 2.5 mg via ORAL
  Filled 2019-09-07 (×3): qty 1

## 2019-09-07 MED ORDER — LEVOTHYROXINE SODIUM 25 MCG PO TABS: 25.0000 ug | ORAL_TABLET | Freq: Every day | ORAL | Status: DC

## 2019-09-07 MED ORDER — ONDANSETRON HCL 4 MG PO TABS: 4.0000 mg | ORAL_TABLET | Freq: Four times a day (QID) | ORAL | Status: DC | PRN

## 2019-09-07 MED ORDER — PANTOPRAZOLE SODIUM 40 MG PO TBEC
40.0000 mg | DELAYED_RELEASE_TABLET | Freq: Every day | ORAL | Status: DC
  Administered 2019-09-07: 40 mg via ORAL
  Filled 2019-09-07: qty 1

## 2019-09-07 MED ORDER — SODIUM CHLORIDE 0.9% FLUSH: 3.0000 mL | Freq: Two times a day (BID) | INTRAVENOUS | Status: DC

## 2019-09-07 MED ORDER — FOSFOMYCIN TROMETHAMINE 3 G PO PACK
3.0000 g | PACK | Freq: Once | ORAL | Status: DC
  Filled 2019-09-07: qty 3

## 2019-09-07 MED ORDER — LOSARTAN POTASSIUM 50 MG PO TABS
50.0000 mg | ORAL_TABLET | Freq: Every day | ORAL | Status: DC
  Administered 2019-09-07: 50 mg via ORAL
  Filled 2019-09-07: qty 1

## 2019-09-07 MED ORDER — ACETAMINOPHEN 325 MG PO TABS: 650.0000 mg | ORAL_TABLET | Freq: Four times a day (QID) | ORAL | Status: DC | PRN

## 2019-09-07 MED ORDER — CARVEDILOL 3.125 MG PO TABS: 6.2500 mg | ORAL_TABLET | Freq: Two times a day (BID) | ORAL | Status: DC

## 2019-09-07 NOTE — Progress Notes (Signed)
Spoke to [employer] ( Legal Guardian) for patient. Strongly expressed that she makes all the decisions for the patient. Pt is very HOH, was informed that hearing aid devices were in pts' bag. Will visit pt today. One of the  designated visitors Echo was completed early this AM.

## 2019-09-07 NOTE — Evaluation (Signed)
Physical Therapy Evaluation Patient Details Name: Gabrielle Navarro MRN: [id number] DOB: 11-Nov-1927 Today's Date: 09/07/2019   History of Present Illness  Gabrielle Navarro is a 86 y.o. female with medical history significant of atrial fibrillation on chronic anticoagulation, dementia, hard of hearing, GERD, benign essential hypertension, hypothyroidism who was apparently out with her daughter today after eating she sat down and had loss of consciousness for a few minutes.  Patient came to and they were thinking she was having some sleepiness.  Clinical Impression  Pt is a pleasant 86 year old F who was admitted for syncope with PMH as described above. Pt performs bed mobility with mod I requiring extra time, transfers with CGA, and ambulation with CGA with RW. Pt requires little physical assistance with mobility, however CGA for safety and frequent tactile cuing for safe mobility secondary to cognitive impairment. Pt performs supine>sit, and ambulates with RW from bedside to bedside commode, requiring manual assistance for RW management, tactile and verbal cuing. Pt has briefs donned upon entry to room, and requires assistance prior to toileting for doffing; RN notified at end of session and present in room to replace [place]. Pt ambulates with RW to return to bedside and performs sit>supine with HOB elevated with mod I. Pt demonstrates deficits with balance, safety awareness requiring continued skilled PT intervention.      Follow Up Recommendations Home health PT;Supervision for mobility/OOB (per pt's daughter, pt already receiving PT 1x/week at ALF)    Equipment Recommendations  None recommended by PT (pt has rollator)    Recommendations for Other Services       Precautions / Restrictions Precautions Precautions: Fall Restrictions Weight Bearing Restrictions: No      Mobility  Bed Mobility Overal bed mobility: Modified Independent             General bed mobility comments: requiring only  extra time for supine<>sit with HOB elevated  Transfers Overall transfer level: Needs assistance Equipment used: Rolling walker (2 wheeled) Transfers: Sit to/from Stand Sit to Stand: Min guard            Ambulation/Gait Ambulation/Gait assistance: Min guard Gait Distance (Feet): 5 Feet Assistive device: Rolling walker (2 wheeled)       General Gait Details: Slow, small steps bilaterally, maintaining RW at an unsafe distance in front of her  Stairs            Wheelchair Mobility    Modified Rankin (Stroke Patients Only)       Balance Overall balance assessment: Needs assistance Sitting-balance support: Feet supported Sitting balance-Leahy Scale: Good Sitting balance - Comments: able to sit steady EOB while readjusting gown   Standing balance support: Bilateral upper extremity supported;During functional activity Standing balance-Leahy Scale: Fair Standing balance comment: able to perform self-hygiene with support in standing                             Pertinent Vitals/Pain Pain Assessment: No/denies pain    Home Living Family/patient expects to be discharged to:: Unsure (Pt is from ALF (memory care unit); however, per daughter pt ambulates independently with rollator without supervision)               Home Equipment: Other (comment) (rollator)      Prior Function Level of Independence: Independent with assistive device(s)         Comments: ambulates independently with rollator at ALF (memory care unit)     Hand Dominance  Extremity/Trunk Assessment   Upper Extremity Assessment Upper Extremity Assessment: Generalized weakness    Lower Extremity Assessment Lower Extremity Assessment: Generalized weakness    Cervical / Trunk Assessment Cervical / Trunk Assessment: Normal  Communication   Communication: HOH;Other (comment) (responds to tactile cuing)  Cognition Arousal/Alertness: Awake/alert Behavior During  Therapy: WFL for tasks assessed/performed Overall Cognitive Status: History of cognitive impairments - at baseline                                        General Comments      Exercises     Assessment/Plan    PT Assessment Patient needs continued PT services  PT Problem List Decreased strength;Decreased mobility;Decreased safety awareness;Decreased balance       PT Treatment Interventions DME instruction;Therapeutic exercise;Gait training;Balance training;Stair training;Neuromuscular re-education;Functional mobility training;Cognitive remediation;Therapeutic activities;Patient/family education    PT Goals (Current goals can be found in the Care Plan section)  Acute Rehab PT Goals Patient Stated Goal: pt unable to state goal; to be as independent as possible, safely (daughter) PT Goal Formulation: With patient/family Time For Goal Achievement: 09/21/19 Potential to Achieve Goals: Good    Frequency Min 2X/week   Barriers to discharge        Co-evaluation               AM-PAC PT "6 Clicks" Mobility  Outcome Measure Help needed turning from your back to your side while in a flat bed without using bedrails?: A Little Help needed moving from lying on your back to sitting on the side of a flat bed without using bedrails?: A Little Help needed moving to and from a bed to a chair (including a wheelchair)?: A Little Help needed standing up from a chair using your arms (e.g., wheelchair or bedside chair)?: A Little Help needed to walk in hospital room?: A Little Help needed climbing 3-5 steps with a railing? : A Little 6 Click Score: 18    End of Session Equipment Utilized During Treatment: Gait belt Activity Tolerance: Patient tolerated treatment well Patient left: in bed;with nursing/sitter in room Nurse Communication: Mobility status PT Visit Diagnosis: Unsteadiness on feet (R26.81);Muscle weakness (generalized) (M62.81)    Time: 4970-2637 PT Time  Calculation (min) (ACUTE ONLY): 25 min   Charges:   PT Evaluation $PT Eval Moderate Complexity: 1 Mod          Kendal Hymen, PT, DPT 09/07/19, 5:15 PM

## 2019-09-07 NOTE — TOC Transition Note (Signed)
Transition of Care [facility]) - CM/SW Discharge Note   Patient Details  Name: Gabrielle Navarro MRN: [id number] Date of Birth: June 26, 1927  [facility] CM/SW Contact:  Maud Deed, LCSW Phone Number: 09/07/2019, 2:16 PM   Clinical Narrative:    Pt medically stable for discharge per MD. Pt will be transported to [facility] via EMS. CSW notified pt's daughter and Tammy with [facility]. EMS will be arranged once nursing staff ready. Call to report number is [phone]   Final next level of care: Assisted Living Barriers to Discharge: No Barriers Identified   Patient Goals and CMS Choice        Discharge Placement              Patient chooses bed at: [facility] Patient to be transferred to facility by: EMS Name of family member notified: Elease Hashimoto Patient and family notified of of transfer: 09/07/19  Discharge Plan and Services                                     Social Determinants of Health (SDOH) Interventions     Readmission Risk Interventions No flowsheet data found.

## 2019-09-07 NOTE — Progress Notes (Signed)
Pt left in the company of EMS. IV pulled. Per day shift RN, admission paperwork printed and admission process complete.

## 2019-09-07 NOTE — Discharge Summary (Signed)
Physician Discharge Summary  Gabrielle Navarro OEU:[id number] DOB: August 28, 1927 DOA: 09/06/2019  PCP: Patient, No Pcp Per  Admit date: 09/06/2019 Discharge date: 09/07/2019  Admitted From: SNF Disposition:  SNF  Recommendations for Outpatient Follow-up:  1. Follow up with PCP in 1-2 weeks 2. Please obtain BMP/CBC in one week 3. Please follow up on the following pending results: Urine culture results  Home Health: No Equipment/Devices: None Discharge Condition: Stable CODE STATUS: Full Diet recommendation: Heart Healthy   Brief/Interim Summary:  Gabrielle Navarro is a 86 y.o. female with medical history significant of atrial fibrillation on chronic anticoagulation, dementia, hard of hearing, GERD, benign essential hypertension, hypothyroidism who was apparently out with her daughter today after eating she sat down and had loss of consciousness for a few minutes.  Patient came to and they were thinking she was having some sleepiness.  She stood up again started having vomiting and then passed out again.  There was no tonic-clonic activity no loss of bladder consciousness.  Patient has since returned back to baseline.  She was brought in for evaluation of the symptoms of syncope.  She denied any palpitations no chest pain.  Denied any shortness of breath or cough.  Patient has remote history of carotid artery stenosis with some surgery.  All her syncope work-up was negative except mild leukocytosis and UA with some leukocytes and bacteria.  Patient is unable to tell any urinary symptoms due to advanced dementia.  Per daughter she was treated for a UTI last month.  Due to her allergies she was given one-time dose of fosfomycin.  If we see any surprising results on her urine culture we will call the facility. She is at baseline now and ready to go back to her facility and resume her home medications.  She will need a follow-up with her primary care physician and cardiologist.  Discharge Diagnoses:  Principal  Problem:   Syncope and collapse Active Problems:   A-fib (HCC)   Dementia (HCC)   HOH (hard of hearing)   GERD (gastroesophageal reflux disease)   Benign essential HTN   Hypothyroidism   Discharge Instructions  Discharge Instructions    Diet - low sodium heart healthy   Complete by: As directed    Increase activity slowly   Complete by: As directed      Allergies as of 09/07/2019      Reactions   Amoxicillin Anaphylaxis   Patient has tolerated cefdinir within the last year.  See full allergy assessment progress note from 10/9   Shellfish Allergy Anaphylaxis   Chocolate Flavor Hives   Sulfa Antibiotics Other (See Comments)   Latex Rash      Medication List    TAKE these medications   apixaban 2.5 MG Tabs tablet Commonly known as: ELIQUIS Take 2.5 mg by mouth 2 (two) times daily.   carvedilol 6.25 MG tablet Commonly known as: COREG Take 6.25 mg by mouth 2 (two) times daily with a meal.   lactose free nutrition Liqd Take 237 mLs by mouth 3 (three) times daily between meals.   levothyroxine 25 MCG tablet Commonly known as: SYNTHROID Take 25 mcg by mouth daily before breakfast.   losartan 50 MG tablet Commonly known as: COZAAR Take 50 mg by mouth 2 (two) times a day.   memantine 28 MG Cp24 24 hr capsule Commonly known as: NAMENDA XR Take 28 mg by mouth daily.   mirtazapine 7.5 MG tablet Commonly known as: REMERON Take 7.5 mg by mouth at bedtime.  omeprazole 10 MG capsule Commonly known as: PRILOSEC Take 10 mg by mouth daily.   traZODone 100 MG tablet Commonly known as: DESYREL Take 100 mg by mouth at bedtime.       Allergies  Allergen Reactions  . Amoxicillin Anaphylaxis    Patient has tolerated cefdinir within the last year.  See full allergy assessment progress note from 10/9  . Shellfish Allergy Anaphylaxis  . Chocolate Flavor Hives  . Sulfa Antibiotics Other (See Comments)  . Latex Rash     Consultations:  None  Procedures/Studies: CT Head Wo Contrast  Result Date: 09/06/2019 CLINICAL DATA:  Loss consciousness upon standing from seated position, no fall or head strike EXAM: CT HEAD WITHOUT CONTRAST TECHNIQUE: Contiguous axial images were obtained from the base of the skull through the vertex without intravenous contrast. COMPARISON:  CT 09/26/2018 FINDINGS: Brain: There stable regions of encephalomalacia involving the left occipital lobe and right cerebellum. No evidence of acute infarction, hemorrhage, hydrocephalus, extra-axial collection or mass lesion/mass effect. Symmetric prominence of the ventricles, cisterns and sulci compatible with parenchymal volume loss. Patchy areas of white matter hypoattenuation are most compatible with chronic microvascular angiopathy. Vascular: Atherosclerotic calcification of the carotid siphons and intradural vertebral arteries. No hyperdense vessel. Skull: No calvarial fracture or suspicious osseous lesion. No scalp swelling or hematoma. Sinuses/Orbits: Paranasal sinuses and mastoid air cells are predominantly clear. Orbital structures are unremarkable aside from prior lens extractions. Other: Few benign dermal calcifications similar to prior. Mild bilateral TMJ arthrosis. IMPRESSION: 1. No acute intracranial abnormality. 2. Stable regions of encephalomalacia involving the left occipital lobe and right cerebellum. 3. Stable parenchymal volume loss and chronic microvascular angiopathy. Electronically Signed   By: Lovena Le M.D.   On: 09/06/2019 22:15   US Carotid Bilateral  Result Date: 09/07/2019 CLINICAL DATA:  86 year old female with a history of syncope. Given history of a left-sided endarterectomy EXAM: BILATERAL CAROTID DUPLEX ULTRASOUND TECHNIQUE: Pearline Cables scale imaging, color Doppler and duplex ultrasound were performed of bilateral carotid and vertebral arteries in the neck. COMPARISON:  None. FINDINGS: Criteria: Quantification of carotid  stenosis is based on velocity parameters that correlate the residual internal carotid diameter with NASCET-based stenosis levels, using the diameter of the distal internal carotid lumen as the denominator for stenosis measurement. The following velocity measurements were obtained: RIGHT ICA:  Systolic 355 cm/sec, Diastolic 26 cm/sec CCA:  79 cm/sec SYSTOLIC ICA/CCA RATIO:  1.5 ECA:  131 cm/sec LEFT ICA:  Systolic 85 cm/sec, Diastolic 21 cm/sec CCA:  71 cm/sec SYSTOLIC ICA/CCA RATIO:  1.2 ECA:  120 cm/sec Right Brachial SBP: Not acquired Left Brachial SBP: Not acquired RIGHT CAROTID ARTERY: No significant calcified disease of the right common carotid artery. Intermediate waveform maintained. Heterogeneous plaque without significant calcifications at the right carotid bifurcation. Low resistance waveform of the right ICA. No significant tortuosity. RIGHT VERTEBRAL ARTERY: Antegrade flow with low resistance waveform. LEFT CAROTID ARTERY: No significant calcified disease of the left common carotid artery. Intermediate waveform maintained. Mild plaque/thickening at the left carotid bifurcation without significant calcifications. Low resistance waveform of the left ICA. LEFT VERTEBRAL ARTERY:  Antegrade flow with low resistance waveform. IMPRESSION: Right: Color duplex indicates minimal heterogeneous plaque, with no hemodynamically significant stenosis by duplex criteria in the extracranial cerebrovascular circulation. Left: Note that the established duplex criteria have not been validated in the setting of prior endarterectomy, however, there is no duplex evidence of recurrent high-grade stenosis. Signed, Dulcy Fanny. Dellia Nims, [facility] Radiology Electronically  Signed   By: Gilmer Mor D.O.   On: 09/07/2019 09:21     Subjective: Patient has no new complaints.  Per daughter she is back to her baseline.  Discharge Exam: Vitals:   09/07/19 0519 09/07/19 0809   BP: (!) 160/75 (!) 153/75  Pulse: 78 88  Resp: 15 16  Temp: 98.2 F (36.8 C) 98.2 F (36.8 C)  SpO2: 98% 97%   Vitals:   09/07/19 0400 09/07/19 0453 09/07/19 0519 09/07/19 0809  BP: 139/70  (!) 160/75 (!) 153/75  Pulse:   78 88  Resp: 12  15 16   Temp:   98.2 F (36.8 C) 98.2 F (36.8 C)  TempSrc:   Oral   SpO2:  98% 98% 97%  Weight:      Height:        General: Pt is alert, awake, not in acute distress Cardiovascular: RRR, S1/S2 +, no rubs, no gallops Respiratory: CTA bilaterally, no wheezing, no rhonchi Abdominal: Soft, NT, ND, bowel sounds + Extremities: no edema, no cyanosis   The results of significant diagnostics from this hospitalization (including imaging, microbiology, ancillary and laboratory) are listed below for reference.    Microbiology: Recent Results (from the past 240 hour(s))  SARS Coronavirus 2 by RT PCR (hospital order, performed in [facility] hospital lab) Nasopharyngeal Nasopharyngeal Swab     Status: None   Collection Time: 09/07/19  3:54 AM   Specimen: Nasopharyngeal Swab  Result Value Ref Range Status   SARS Coronavirus 2 NEGATIVE NEGATIVE Final    Comment: (NOTE) SARS-CoV-2 target nucleic acids are NOT DETECTED.  The SARS-CoV-2 RNA is generally detectable in upper and lower respiratory specimens during the acute phase of infection. The lowest concentration of SARS-CoV-2 viral copies this assay can detect is 250 copies / mL. A negative result does not preclude SARS-CoV-2 infection and should not be used as the sole basis for treatment or other patient management decisions.  A negative result may occur with improper specimen collection / handling, submission of specimen other than nasopharyngeal swab, presence of viral mutation(s) within the areas targeted by this assay, and inadequate number of viral copies (<250 copies / mL). A negative result must be combined with clinical observations, patient history, and epidemiological  information.  Fact Sheet for Patients:   09/09/19  Fact Sheet for Healthcare Providers: BoilerBrush.com.cy  This test is not yet approved or  cleared by the https://pope.com/ FDA and has been authorized for detection and/or diagnosis of SARS-CoV-2 by FDA under an Emergency Use Authorization (EUA).  This EUA will remain in effect (meaning this test can be used) for the duration of the COVID-19 declaration under Section 564(b)(1) of the Act, 21 U.S.C. section 360bbb-3(b)(1), unless the authorization is terminated or revoked sooner.  Performed at [facility], [address] [place], [place]      Labs: BNP (last 3 results) No results for input(s): BNP in the last 8760 hours. Basic Metabolic Panel: Recent Labs  Lab 09/06/19 1942  NA 136  K 4.4  CL 105  CO2 25  GLUCOSE 130*  BUN 27*  CREATININE 0.81  CALCIUM 8.5*   Liver Function Tests: Recent Labs  Lab 09/06/19 1942  AST 27  ALT 13  ALKPHOS 64  BILITOT 1.0  PROT 6.3*  ALBUMIN 3.4*   Recent Labs  Lab 09/06/19 1942  LIPASE 49   No results for input(s): AMMONIA in the last 168 hours. CBC: Recent Labs  Lab 09/06/19 1942  WBC 5.0  NEUTROABS 2.8  HGB 13.0  HCT 40.0  MCV 96.6  PLT 189   Cardiac Enzymes: No results for input(s): CKTOTAL, CKMB, CKMBINDEX, TROPONINI in the last 168 hours. BNP: Invalid input(s): POCBNP CBG: Recent Labs  Lab 09/07/19 0518  GLUCAP 106*   D-Dimer No results for input(s): DDIMER in the last 72 hours. Hgb A1c No results for input(s): HGBA1C in the last 72 hours. Lipid Profile No results for input(s): CHOL, HDL, LDLCALC, TRIG, CHOLHDL, LDLDIRECT in the last 72 hours. Thyroid function studies Recent Labs    09/06/19 2226  TSH 1.398   Anemia work up No results for input(s): VITAMINB12, FOLATE, FERRITIN, TIBC, IRON, RETICCTPCT in the last 72 hours. Urinalysis    Component Value Date/Time    COLORURINE YELLOW (A) 09/06/2019 1942   APPEARANCEUR HAZY (A) 09/06/2019 1942   LABSPEC 1.018 09/06/2019 1942   PHURINE 6.0 09/06/2019 1942   GLUCOSEU NEGATIVE 09/06/2019 1942   HGBUR NEGATIVE 09/06/2019 1942   BILIRUBINUR NEGATIVE 09/06/2019 1942   KETONESUR NEGATIVE 09/06/2019 1942   PROTEINUR NEGATIVE 09/06/2019 1942   NITRITE NEGATIVE 09/06/2019 1942   LEUKOCYTESUR MODERATE (A) 09/06/2019 1942   Sepsis Labs Invalid input(s): PROCALCITONIN,  WBC,  LACTICIDVEN Microbiology Recent Results (from the past 240 hour(s))  SARS Coronavirus 2 by RT PCR (hospital order, performed in [facility] hospital lab) Nasopharyngeal Nasopharyngeal Swab     Status: None   Collection Time: 09/07/19  3:54 AM   Specimen: Nasopharyngeal Swab  Result Value Ref Range Status   SARS Coronavirus 2 NEGATIVE NEGATIVE Final    Comment: (NOTE) SARS-CoV-2 target nucleic acids are NOT DETECTED.  The SARS-CoV-2 RNA is generally detectable in upper and lower respiratory specimens during the acute phase of infection. The lowest concentration of SARS-CoV-2 viral copies this assay can detect is 250 copies / mL. A negative result does not preclude SARS-CoV-2 infection and should not be used as the sole basis for treatment or other patient management decisions.  A negative result may occur with improper specimen collection / handling, submission of specimen other than nasopharyngeal swab, presence of viral mutation(s) within the areas targeted by this assay, and inadequate number of viral copies (<250 copies / mL). A negative result must be combined with clinical observations, patient history, and epidemiological information.  Fact Sheet for Patients:   BoilerBrush.com.cy  Fact Sheet for Healthcare Providers: https://pope.com/  This test is not yet approved or  cleared by the [place] FDA and has been authorized for detection and/or diagnosis of SARS-CoV-2  by FDA under an Emergency Use Authorization (EUA).  This EUA will remain in effect (meaning this test can be used) for the duration of the COVID-19 declaration under Section 564(b)(1) of the Act, 21 U.S.C. section 360bbb-3(b)(1), unless the authorization is terminated or revoked sooner.  Performed at [facility], [address] [place], [place] [address]     Time coordinating discharge: Over 30 minutes  SIGNED:  Arnetha Courser, MD  [facility] 09/07/2019, 12:40 PM  If 7PM-7AM, please contact night-coverage [email or web address]  This record has been created using Conservation officer, historic buildings. Errors have been sought and corrected,but may not always be located. Such creation errors do not reflect on the standard of care.

## 2019-09-08 LAB — URINE CULTURE: Culture: NO GROWTH

## 2020-10-03 ENCOUNTER — Other Ambulatory Visit: Payer: Self-pay

## 2020-10-03 ENCOUNTER — Emergency Department: Payer: Medicare Other

## 2020-10-03 ENCOUNTER — Inpatient Hospital Stay
Admission: EM | Admit: 2020-10-03 | Discharge: 2020-10-05 | DRG: 603 | Disposition: A | Discharge status: Home Health | Payer: Medicare Other | Source: Skilled Nursing Facility | Attending: Internal Medicine | Admitting: Internal Medicine

## 2020-10-03 ENCOUNTER — Observation Stay: Payer: Medicare Other

## 2020-10-03 DIAGNOSIS — Z20822 Contact with and (suspected) exposure to covid-19: Secondary | ICD-10-CM | POA: Diagnosis present

## 2020-10-03 DIAGNOSIS — G309 Alzheimer's disease, unspecified: Secondary | ICD-10-CM | POA: Diagnosis not present

## 2020-10-03 DIAGNOSIS — I10 Essential (primary) hypertension: Secondary | ICD-10-CM

## 2020-10-03 DIAGNOSIS — H9193 Unspecified hearing loss, bilateral: Secondary | ICD-10-CM

## 2020-10-03 DIAGNOSIS — H9190 Unspecified hearing loss, unspecified ear: Secondary | ICD-10-CM

## 2020-10-03 DIAGNOSIS — Z79899 Other long term (current) drug therapy: Secondary | ICD-10-CM

## 2020-10-03 DIAGNOSIS — K047 Periapical abscess without sinus: Secondary | ICD-10-CM | POA: Diagnosis present

## 2020-10-03 DIAGNOSIS — Z79890 Hormone replacement therapy (postmenopausal): Secondary | ICD-10-CM

## 2020-10-03 DIAGNOSIS — L0390 Cellulitis, unspecified: Secondary | ICD-10-CM | POA: Diagnosis present

## 2020-10-03 DIAGNOSIS — Z8673 Personal history of transient ischemic attack (TIA), and cerebral infarction without residual deficits: Secondary | ICD-10-CM

## 2020-10-03 DIAGNOSIS — F0280 Dementia in other diseases classified elsewhere without behavioral disturbance: Secondary | ICD-10-CM | POA: Diagnosis present

## 2020-10-03 DIAGNOSIS — I4821 Permanent atrial fibrillation: Secondary | ICD-10-CM | POA: Diagnosis not present

## 2020-10-03 DIAGNOSIS — F0390 Unspecified dementia without behavioral disturbance: Secondary | ICD-10-CM | POA: Diagnosis present

## 2020-10-03 DIAGNOSIS — I482 Chronic atrial fibrillation, unspecified: Secondary | ICD-10-CM | POA: Diagnosis present

## 2020-10-03 DIAGNOSIS — L03211 Cellulitis of face: Principal | ICD-10-CM | POA: Diagnosis present

## 2020-10-03 DIAGNOSIS — Z66 Do not resuscitate: Secondary | ICD-10-CM | POA: Diagnosis present

## 2020-10-03 DIAGNOSIS — Z880 Allergy status to penicillin: Secondary | ICD-10-CM

## 2020-10-03 DIAGNOSIS — Z91013 Allergy to seafood: Secondary | ICD-10-CM

## 2020-10-03 DIAGNOSIS — K219 Gastro-esophageal reflux disease without esophagitis: Secondary | ICD-10-CM

## 2020-10-03 DIAGNOSIS — I4891 Unspecified atrial fibrillation: Secondary | ICD-10-CM | POA: Diagnosis present

## 2020-10-03 DIAGNOSIS — E039 Hypothyroidism, unspecified: Secondary | ICD-10-CM | POA: Diagnosis present

## 2020-10-03 DIAGNOSIS — G4700 Insomnia, unspecified: Secondary | ICD-10-CM | POA: Diagnosis present

## 2020-10-03 DIAGNOSIS — Z7901 Long term (current) use of anticoagulants: Secondary | ICD-10-CM

## 2020-10-03 LAB — CBC
HCT: 43.2 % (ref 36.0–46.0)
Hemoglobin: 14.3 g/dL (ref 12.0–15.0)
MCH: 31.2 pg (ref 26.0–34.0)
MCHC: 33.1 g/dL (ref 30.0–36.0)
MCV: 94.1 fL (ref 80.0–100.0)
Platelets: 159 10*3/uL (ref 150–400)
RBC: 4.59 MIL/uL (ref 3.87–5.11)
RDW: 13.5 % (ref 11.5–15.5)
WBC: 13.1 10*3/uL — ABNORMAL HIGH (ref 4.0–10.5)
nRBC: 0 % (ref 0.0–0.2)

## 2020-10-03 LAB — BASIC METABOLIC PANEL
Anion gap: 6 (ref 5–15)
BUN: 41 mg/dL — ABNORMAL HIGH (ref 8–23)
CO2: 23 mmol/L (ref 22–32)
Calcium: 9.3 mg/dL (ref 8.9–10.3)
Chloride: 108 mmol/L (ref 98–111)
Creatinine, Ser: 0.75 mg/dL (ref 0.44–1.00)
GFR, Estimated: >60 mL/min (ref >60)
Glucose, Bld: 162 mg/dL — ABNORMAL HIGH (ref 70–99)
Potassium: 4.1 mmol/L (ref 3.5–5.1)
Sodium: 137 mmol/L (ref 135–145)

## 2020-10-03 LAB — RESP PANEL BY RT-PCR (FLU A&B, COVID) ARPGX2
Influenza A by PCR: NEGATIVE
Influenza B by PCR: NEGATIVE
SARS Coronavirus 2 by RT PCR: NEGATIVE

## 2020-10-03 LAB — CK: Total CK: 298 U/L — ABNORMAL HIGH (ref 38–234)

## 2020-10-03 MED ORDER — LORAZEPAM 2 MG/ML IJ SOLN
0.5000 mg | Freq: Four times a day (QID) | INTRAMUSCULAR | Status: AC | PRN
  Administered 2020-10-03: 0.5 mg via INTRAVENOUS
  Filled 2020-10-03: qty 1

## 2020-10-03 MED ORDER — APIXABAN 2.5 MG PO TABS
2.5000 mg | ORAL_TABLET | Freq: Two times a day (BID) | ORAL | Status: DC
  Administered 2020-10-03 – 2020-10-05 (×4): 2.5 mg via ORAL
  Filled 2020-10-03 (×4): qty 1

## 2020-10-03 MED ORDER — ONDANSETRON HCL 4 MG/2ML IJ SOLN: 4.0000 mg | Freq: Four times a day (QID) | INTRAMUSCULAR | Status: DC | PRN

## 2020-10-03 MED ORDER — IOHEXOL 300 MG/ML  SOLN
75.0000 mL | Freq: Once | INTRAMUSCULAR | Status: AC | PRN
  Administered 2020-10-03: 75 mL via INTRAVENOUS

## 2020-10-03 MED ORDER — ACETAMINOPHEN 650 MG RE SUPP: 650.0000 mg | Freq: Four times a day (QID) | RECTAL | Status: DC | PRN

## 2020-10-03 MED ORDER — TRAZODONE HCL 100 MG PO TABS
100.0000 mg | ORAL_TABLET | Freq: Every day | ORAL | Status: DC
  Administered 2020-10-03 – 2020-10-04 (×2): 100 mg via ORAL
  Filled 2020-10-03 (×2): qty 1

## 2020-10-03 MED ORDER — GADOBUTROL 1 MMOL/ML IV SOLN
5.0000 mL | Freq: Once | INTRAVENOUS | Status: AC | PRN
  Administered 2020-10-03: 5 mL via INTRAVENOUS

## 2020-10-03 MED ORDER — MIRTAZAPINE 15 MG PO TABS
7.5000 mg | ORAL_TABLET | Freq: Every day | ORAL | Status: DC
  Administered 2020-10-03 – 2020-10-04 (×2): 7.5 mg via ORAL
  Filled 2020-10-03 (×2): qty 1

## 2020-10-03 MED ORDER — VANCOMYCIN HCL 750 MG/150ML IV SOLN
750.0000 mg | INTRAVENOUS | Status: DC
  Filled 2020-10-03: qty 150

## 2020-10-03 MED ORDER — ACETAMINOPHEN 500 MG PO TABS
1000.0000 mg | ORAL_TABLET | Freq: Four times a day (QID) | ORAL | Status: DC | PRN
  Administered 2020-10-04: 1000 mg via ORAL
  Filled 2020-10-03: qty 2

## 2020-10-03 MED ORDER — ONDANSETRON HCL 4 MG PO TABS: 4.0000 mg | ORAL_TABLET | Freq: Four times a day (QID) | ORAL | Status: DC | PRN

## 2020-10-03 MED ORDER — METRONIDAZOLE 500 MG/100ML IV SOLN
500.0000 mg | Freq: Three times a day (TID) | INTRAVENOUS | Status: DC
  Administered 2020-10-03 – 2020-10-04 (×3): 500 mg via INTRAVENOUS
  Filled 2020-10-03 (×4): qty 100

## 2020-10-03 MED ORDER — CARVEDILOL 6.25 MG PO TABS
6.2500 mg | ORAL_TABLET | Freq: Two times a day (BID) | ORAL | Status: DC
  Administered 2020-10-04 – 2020-10-05 (×3): 6.25 mg via ORAL
  Filled 2020-10-03 (×3): qty 1

## 2020-10-03 MED ORDER — MEMANTINE HCL ER 28 MG PO CP24
28.0000 mg | ORAL_CAPSULE | Freq: Every day | ORAL | Status: DC
  Administered 2020-10-04 – 2020-10-05 (×2): 28 mg via ORAL
  Filled 2020-10-03 (×2): qty 1

## 2020-10-03 MED ORDER — BOOST PO LIQD
237.0000 mL | Freq: Three times a day (TID) | ORAL | Status: DC
  Administered 2020-10-03: 237 mL via ORAL
  Administered 2020-10-04: 1 via ORAL
  Administered 2020-10-04 – 2020-10-05 (×3): 237 mL via ORAL
  Filled 2020-10-03: qty 237

## 2020-10-03 MED ORDER — LEVOTHYROXINE SODIUM 50 MCG PO TABS
25.0000 ug | ORAL_TABLET | Freq: Every day | ORAL | Status: DC
  Administered 2020-10-04 – 2020-10-05 (×2): 25 ug via ORAL
  Filled 2020-10-03 (×2): qty 1

## 2020-10-03 MED ORDER — LOSARTAN POTASSIUM 50 MG PO TABS
50.0000 mg | ORAL_TABLET | Freq: Every day | ORAL | Status: DC
  Administered 2020-10-04 – 2020-10-05 (×2): 50 mg via ORAL
  Filled 2020-10-03 (×2): qty 1

## 2020-10-03 MED ORDER — VANCOMYCIN HCL IN DEXTROSE 1-5 GM/200ML-% IV SOLN
1000.0000 mg | Freq: Once | INTRAVENOUS | Status: AC
  Administered 2020-10-03: 1000 mg via INTRAVENOUS
  Filled 2020-10-03: qty 200

## 2020-10-03 MED ORDER — SODIUM CHLORIDE 0.9 % IV SOLN
1.0000 g | INTRAVENOUS | Status: DC
  Administered 2020-10-03: 1 g via INTRAVENOUS
  Filled 2020-10-03: qty 1
  Filled 2020-10-03: qty 10

## 2020-10-03 MED ORDER — PANTOPRAZOLE SODIUM 40 MG PO TBEC
40.0000 mg | DELAYED_RELEASE_TABLET | Freq: Every day | ORAL | Status: DC
  Administered 2020-10-04 – 2020-10-05 (×2): 40 mg via ORAL
  Filled 2020-10-03 (×2): qty 1

## 2020-10-03 NOTE — ED Provider Notes (Signed)
[facility] Emergency Department Provider Note  Time seen: 3:11 PM  I have reviewed the triage vital signs and the nursing notes.   HISTORY  Chief Complaint Facial Swelling   HPI Gabrielle Navarro is a 87 y.o. female with a past medical history of dementia, gastric reflux, atrial fibrillation on Eliquis presents to the emergency department for right facial pain redness and swelling.  According to the daughter she saw the patient yesterday who appeared normal, today the nursing home was concerned because she woke after a nap with redness to her right face.  Daughter brought the patient to the emergency department where she was found to have redness tenderness and swelling of the right face which the daughter states was not present yesterday.  Patient found to have a low-grade borderline temperature 99.8.  Patient has baseline dementia does state pain to the right side of her face.   Past Medical History:  Diagnosis Date   Atrial fibrillation (HCC)    Dementia (HCC)    GERD (gastroesophageal reflux disease)     Patient Active Problem List   Diagnosis Date Noted   A-fib (HCC) 09/06/2019   Dementia (HCC) 09/06/2019   HOH (hard of hearing) 09/06/2019   GERD (gastroesophageal reflux disease) 09/06/2019   Benign essential HTN 09/06/2019   Hypothyroidism 09/06/2019   Syncope and collapse 09/06/2019    No past surgical history on file.  Prior to Admission medications   Medication Sig Start Date End Date Taking? Authorizing Provider  apixaban (ELIQUIS) 2.5 MG TABS tablet Take 2.5 mg by mouth 2 (two) times daily.  05/04/18   [provider]  carvedilol (COREG) 6.25 MG tablet Take 6.25 mg by mouth 2 (two) times daily with a meal.  01/01/18   [provider]  lactose free nutrition (BOOST) LIQD Take 237 mLs by mouth 3 (three) times daily between meals.    [provider]  levothyroxine (SYNTHROID, LEVOTHROID) 25 MCG tablet Take 25 mcg by mouth  daily before breakfast.  02/09/16   [provider]  losartan (COZAAR) 50 MG tablet Take 50 mg by mouth 2 (two) times a day. 07/02/18   [provider]  memantine (NAMENDA XR) 28 MG CP24 24 hr capsule Take 28 mg by mouth daily.    [provider]  mirtazapine (REMERON) 7.5 MG tablet Take 7.5 mg by mouth at bedtime.    [provider]  omeprazole (PRILOSEC) 10 MG capsule Take 10 mg by mouth daily.  04/07/18   [provider]  traZODone (DESYREL) 100 MG tablet Take 100 mg by mouth at bedtime.  12/11/17   [provider]  QUEtiapine (SEROQUEL) 25 MG tablet Take 25 mg by mouth every evening. 07/20/18 09/26/18  [provider]    Allergies  Allergen Reactions   Amoxicillin Anaphylaxis    Patient has tolerated cefdinir within the last year.  See full allergy assessment progress note from 10/9   Shellfish Allergy Anaphylaxis   Chocolate Flavor Hives   Sulfa Antibiotics Other (See Comments)   Latex Rash    No family history on file.  Social History Social History   Tobacco Use   Smoking status: Never   Smokeless tobacco: Never  Substance Use Topics   Alcohol use: Never   Drug use: Never    Review of Systems Unable to obtain adequate/accurate review of systems secondary to baseline dementia.  ____________________________________________   PHYSICAL EXAM:  VITAL SIGNS: ED Triage Vitals  Enc Vitals Group  BP 10/03/20 1358 (!) 143/59     Pulse Rate 10/03/20 1358 97     Resp 10/03/20 1358 20     Temp 10/03/20 1358 99.8 F (37.7 C)     Temp Source 10/03/20 1358 Oral     SpO2 10/03/20 1358 96 %     Weight 10/03/20 1359 115 lb (52.2 kg)     Height 10/03/20 1359 5\' 5"  (1.651 m)     Head Circumference --      Peak Flow --      Pain Score --      Pain Loc --      Pain Edu? --      Excl. in GC? --    Constitutional: Alert and oriented. Well appearing and in no distress. Eyes: Normal exam, mild periorbital edema ENT       Head: Moderate erythema and tenderness and swelling of the right maxillary area      Mouth/Throat: Mucous membranes are moist. Cardiovascular: Normal rate, regular rhythm. No murmurs, rubs, or gallops. Respiratory: Normal respiratory effort without tachypnea nor retractions. Breath sounds are clear Gastrointestinal: Soft and nontender. No distention.  Musculoskeletal: Nontender with normal range of motion in all extremities.  Neurologic:  Normal speech and language. No gross focal neurologic deficits Skin:  Skin is warm, dry and intact.  Psychiatric: Mood and affect are normal.  ____________________________________________   RADIOLOGY  IMPRESSION:  1. Focal lucency about the root of the right canine tooth with  adjacent subperiosteal abscess measuring up to 12 mm in cephalo  caudad dimension.  2. Extensive inflammatory changes extend from the subperiosteal  abscess lateral and inferior to the orbit extending over the malar  eminence.  3. No discrete drainable fluid collection.  4. Reactive type lymph nodes bilaterally.  5. Remote left occipital lobe infarct.   ____________________________________________   INITIAL IMPRESSION / ASSESSMENT AND PLAN / ED COURSE  Pertinent labs & imaging results that were available during my care of the patient were reviewed by me and considered in my medical decision making (see chart for details).   Patient presents to the emergency department for redness tenderness and swelling to the right side of her face which started today.  Overall the patient appears well, no distress.  Does appear to have moderate right-sided facial cellulitis, white blood cell count of 13.1, borderline low-grade temperature 99.8.  We will proceed with a CT scan with contrast of the right face, check blood cultures and infuse IV antibiotics.  Daughter states several days ago the patient did see a dentist but states no procedures were done.  Patient does have mild tenderness  along the gums of her right upper molars however no obvious sign of abscess.  CT scan shows signs of a small dental abscess with surrounding facial cellulitis.  Given the patient's tenderness, redness swelling that appeared overnight along with low-grade borderline temperature and leukocytosis we will admit to the hospital service for IV antibiotics.  Patient/daughter agreeable to plan of care.  Shavonta Gossen was evaluated in Emergency Department on 10/03/2020 for the symptoms described in the history of present illness. She was evaluated in the context of the global COVID-19 pandemic, which necessitated consideration that the patient might be at risk for infection with the SARS-CoV-2 virus that causes COVID-19. Institutional protocols and algorithms that pertain to the evaluation of patients at risk for COVID-19 are in a state of rapid change based on information released by regulatory bodies including the CDC and federal and  state organizations. These policies and algorithms were followed during the patient's care in the ED.  ____________________________________________   FINAL CLINICAL IMPRESSION(S) / ED DIAGNOSES  Right facial cellulitis   Minna Antis, MD 10/03/20 1643

## 2020-10-03 NOTE — ED Notes (Signed)
Patient transported to MRI at this time. 

## 2020-10-03 NOTE — H&P (Signed)
History and Physical   Gabrielle ArenaSarah Mcbryar Navarro:[id number]:[id number] DOB: 1927/06/04 DOA: 10/03/2020  PCP: Patient, No Pcp Per (Inactive)  Outpatient Specialists: [facility] Darrold [facility]aschos, [facility] Patient coming from: [facility]  I have personally briefly reviewed patient's [facility].  Chief Concern: redness  HPI: Gabrielle Navarro is a 87 y.o. female with medical history significant for hypertension, atrial fibrillation, Alzheimer's dementia, hypothyroid, insomnia, presents emergency department for chief concerns of redness on her face.  At bedside patient is able to tell me her name and her current age.  Daughter, Gabrielle Navarro, at bedside, went to see patient yesterday at 10/02/20 and she reports that her mother did not have redness on her face and or surrounding her eyes..   [facility] patient ate breakfast well, took a nap at around 11 AM and woke up with the red rash on her face.  Per Gabrielle HashimotoPatricia, the skin lesion on her right cheek has been present for the last 2 days.  Family denies fever, nausea, vomiting.   Per Gabrielle HashimotoPatricia, patient's last dental appointment was a cleaning in May 2022.  She was told by the dentist that there is a bridge that will need replacement however given the patient's age and her need for anticoagulation, the dentist recommends that patient remain on anticoagulation and the old bridge will have to stay without replacement.  Social history: Family denies tobacco, etoh, and recreational drug use. 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  Vaccination history: Patient is vaccinated for COVID-19 with two vaccines and two booster, Moderna  ROS: Unable to complete due to dementia  When asked if patient has pain with eye movement inside the eye she just reports that it feels weird and she keeps touching her face stating that she has an infection.  ED Course: Discussed with ED provider, patient requiring hospitalization for facial  cellulitis.  Vitals in the emergency department was remarkable for temperature of 99.8, respiration rate of 20, heart rate 97, blood pressure 143/59, SPO2 of 96% on room air.  EDP ordered IV vancomycin.  Labs in the emergency department was remarkable for sodium 137, potassium 4.1, chloride 108, BUN 41, serum creatinine of 0.75, nonfasting blood glucose 142, WBC of 13.1, hemoglobin 14.3, platelets 159.  COVID /influenza A/influenza B PCR were negative  Assessment/Plan  Principal Problem:   Cellulitis Active Problems:   A-fib (HCC)   Dementia (HCC)   HOH (hard of hearing)   GERD (gastroesophageal reflux disease)   Benign essential HTN   Hypothyroidism   # Right facial cellulitis # Leukocytosis - CT maxillofacial with contrast was read as focal lucency about the root of the right canine tooth with adjacent subperiosteal abscess measuring 12 mm in cephalocaudad dimension; extensive inflammatory changes extend from the subperiosteal abscess lateral and inferior to the orbit extending over the malar eminence; no discrete drainable fluid collection - Status post vancomycin per EDP - I ordered vancomycin, metronidazole for anaerobic coverage, and ceftriaxone - Patient has an allergy to penicillin however has tolerated cefdinir in the past - Stat MRI of the orbits with and without contrast ordered: Which was read as extensive soft tissue swelling and enhancement involving the partially visualized right facial soft tissue, consistent with acute infection/cellulitis.  Involvement of the right preseptal soft tissues without visible post septal or intraorbital extension at this time.  No evidence of intracranial spread of infection. - Discussed with Gabrielle Navarro extensively regarding calling patient's dentist as soon as possible  to arrange for right canine tooth extraction appointment  # Hypothyroid-levothyroxine 25 mcg daily before breakfast resumed # Atrial fibrillation-on Eliquis 2.5 mg twice  daily, resumed # Hypertension-losartan 50 mg resumed, carvedilol 6.25 mg twice daily resumed # GERD-PPI # Insomnia-trazodone 100 mg nightly  # History of remote left occipital lobe infarct - family is aware # Dementia suspect Alzheimer-resumed home memantine 28 mg daily, would recommend nursing staff allow family to stay overnight to prevent delirium and or sundowning  # Protein/calorie malnutrition-moderate to severe-mirtazapine 7.5 mg nightly - Resumed home lactulose free nutrition boost 3 times daily between meals  Chart reviewed.   DVT prophylaxis: Eliquis Code Status: DNR Diet: Heart healthy Family Communication: Jaclyn Shaggy, daughter at bedside and she can be reached at mobile [phone] Disposition Plan: Pending clinical course Consults called: not at this time Admission status: MedSurg, observation, telemetry  Past Medical History:  Diagnosis Date   Atrial fibrillation (HCC)    Dementia (HCC)    GERD (gastroesophageal reflux disease)    No past surgical history on file.  Social History:  reports that she has never smoked. She has never used smokeless tobacco. She reports that she does not drink alcohol and does not use drugs.  Allergies  Allergen Reactions   Amoxicillin Anaphylaxis    Patient has tolerated cefdinir within the last year.  See full allergy assessment progress note from 10/9   Shellfish Allergy Anaphylaxis   Chocolate Flavor Hives   Sulfa Antibiotics Other (See Comments)   Latex Rash   No family history on file. Family history: Family history reviewed and not pertinent  Prior to Admission medications   Medication Sig Start Date End Date Taking? Authorizing Provider  apixaban (ELIQUIS) 2.5 MG TABS tablet Take 2.5 mg by mouth 2 (two) times daily.  05/04/18   [provider]  carvedilol (COREG) 6.25 MG tablet Take 6.25 mg by mouth 2 (two) times daily with a meal.  01/01/18   [provider]  lactose free nutrition (BOOST) LIQD  Take 237 mLs by mouth 3 (three) times daily between meals.    [provider]  levothyroxine (SYNTHROID, LEVOTHROID) 25 MCG tablet Take 25 mcg by mouth daily before breakfast.  02/09/16   [provider]  losartan (COZAAR) 50 MG tablet Take 50 mg by mouth 2 (two) times a day. 07/02/18   [provider]  memantine (NAMENDA XR) 28 MG CP24 24 hr capsule Take 28 mg by mouth daily.    [provider]  mirtazapine (REMERON) 7.5 MG tablet Take 7.5 mg by mouth at bedtime.    [provider]  omeprazole (PRILOSEC) 10 MG capsule Take 10 mg by mouth daily.  04/07/18   [provider]  traZODone (DESYREL) 100 MG tablet Take 100 mg by mouth at bedtime.  12/11/17   [provider]  QUEtiapine (SEROQUEL) 25 MG tablet Take 25 mg by mouth every evening. 07/20/18 09/26/18  [provider]   Physical Exam: Vitals:   10/03/20 1358 10/03/20 1359 10/03/20 1530 10/03/20 1645  BP: (!) 143/59  (!) 146/65 (!) 159/72  Pulse: 97  86 92  Resp: 20   18  Temp: 99.8 F (37.7 C)     TempSrc: Oral     SpO2: 96%  96% 97%  Weight:  52.2 kg    Height:  5\' 5"  (1.651 m)     Constitutional: appears age-appropriate, frail, NAD, calm, comfortable Eyes: PERRL, lids and conjunctivae normal ENMT: Mucous membranes are moist. Posterior  pharynx clear of any exudate or lesions. Age-appropriate dentition. Hearing loss. Neck: normal, supple, no masses, no thyromegaly Respiratory: clear to auscultation bilaterally, no wheezing, no crackles. Normal respiratory effort. No accessory muscle use.  Cardiovascular: Regular rate and rhythm, no murmurs / rubs / gallops. No extremity edema. 2+ pedal pulses. No carotid bruits.  Abdomen: no tenderness, no masses palpated, no hepatosplenomegaly. Bowel sounds positive.  Musculoskeletal: no clubbing / cyanosis. No joint deformity upper and lower extremities. Good ROM, no contractures, no atrophy. Normal muscle tone.  Skin: + rashes,  +lesions, ulcers. + redness and erythema; thinned skin   Neurologic: Sensation intact. Strength 5/5 in all 4.  Psychiatric: Normal judgment and insight. Alert and oriented x 3. Normal mood.   EKG: not indicated  Imaging on Admission: I personally reviewed and I agree with radiologist reading as below.  CT Maxillofacial W Contrast  Result Date: 10/03/2020 CLINICAL DATA:  Cellulitis, right facial. EXAM: CT MAXILLOFACIAL WITH CONTRAST TECHNIQUE: Multidetector CT imaging of the maxillofacial structures was performed with intravenous contrast. Multiplanar CT image reconstructions were also generated. CONTRAST:  70mL OMNIPAQUE IOHEXOL 300 MG/ML  SOLN COMPARISON:  CT head without contrast 09/06/2019 FINDINGS: Osseous: Focal lucency is noted about the root of the right canine tooth. There is breech of the cortex with adjacent subperiosteal abscess measuring up to 12 mm in cephalo caudad dimension. No other focal dental abscess is present. No focal osseous lesions are present. No acute fracture is present. Orbits: Right periorbital soft tissue swelling is present. There is no postseptal inflammatory change. Bilateral lens replacements are present. Globes are unremarkable. Orbits are otherwise within normal limits. Sinuses: The paranasal sinuses and mastoid air cells are clear. Soft tissues: Extensive inflammatory changes extend from the subperiosteal abscess. Soft tissue inflammation noted lateral and inferior to the orbit extending over the malar eminence. No discrete drainable fluid collection is present. Inflammatory changes extend superficially to the level of the mandible. Reactive type lymph nodes are present bilaterally. No necrotic nodes are present. Limited intracranial: Remote left occipital lobe infarct again noted. No acute intracranial abnormality. IMPRESSION: 1. Focal lucency about the root of the right canine tooth with adjacent subperiosteal abscess measuring up to 12 mm in cephalo caudad  dimension. 2. Extensive inflammatory changes extend from the subperiosteal abscess lateral and inferior to the orbit extending over the malar eminence. 3. No discrete drainable fluid collection. 4. Reactive type lymph nodes bilaterally. 5. Remote left occipital lobe infarct. Electronically Signed   By: Marin Roberts M.D.   On: 10/03/2020 16:20    Labs on Admission: I have personally reviewed following labs  CBC: Recent Labs  Lab 10/03/20 1402  WBC 13.1*  HGB 14.3  HCT 43.2  MCV 94.1  PLT 159   Basic Metabolic Panel: Recent Labs  Lab 10/03/20 1402  NA 137  K 4.1  CL 108  CO2 23  GLUCOSE 162*  BUN 41*  CREATININE 0.75  CALCIUM 9.3   GFR: Estimated Creatinine Clearance: 38.5 mL/min (by C-G formula based on SCr of 0.75 mg/dL).  Urine analysis:    Component Value Date/Time   COLORURINE YELLOW (A) 09/06/2019 1942   APPEARANCEUR HAZY (A) 09/06/2019 1942   LABSPEC 1.018 09/06/2019 1942   PHURINE 6.0 09/06/2019 1942   GLUCOSEU NEGATIVE 09/06/2019 1942   HGBUR NEGATIVE 09/06/2019 1942   BILIRUBINUR NEGATIVE 09/06/2019 1942   KETONESUR NEGATIVE 09/06/2019 1942   PROTEINUR NEGATIVE 09/06/2019 1942   NITRITE NEGATIVE 09/06/2019 1942   LEUKOCYTESUR MODERATE (A) 09/06/2019 1942  Dr. Sedalia Muta [facility]  If 7PM-7AM, please contact overnight-coverage provider If 7AM-7PM, please contact day coverage provider [email or web address]  10/03/2020, 6:43 PM

## 2020-10-03 NOTE — Progress Notes (Signed)
Pharmacy Antibiotic Note  Gabrielle Navarro is a 87 y.o. female with a past medical history of dementia, gastric reflux, atrial fibrillation on Eliquis presents to the emergency department for right facial pain redness and swelling. Pharmacy has been consulted for vancomycin dosing for cellulitis. Pt received 1g IV loading dose of vancomycin in the ED.   WBC 13.1  Plan: Vancomycin 750 mg IV q24h for 7 days as indicated on initial consult Est AUC: 548 Monitor renal function and adjust dose as clinically indicated Obtain vancomycin levels around 4th or 5th dose  Height: 5\' 5"  (165.1 cm) Weight: 52.2 kg (115 lb) IBW/kg (Calculated) : 57  Temp (24hrs), Avg:99.8 F (37.7 C), Min:99.8 F (37.7 C), Max:99.8 F (37.7 C)  Recent Labs  Lab 10/03/20 1402  WBC 13.1*  CREATININE 0.75    Estimated Creatinine Clearance: 38.5 mL/min (by C-G formula based on SCr of 0.75 mg/dL).    Allergies  Allergen Reactions   Amoxicillin Anaphylaxis    Patient has tolerated cefdinir within the last year.  See full allergy assessment progress note from 10/9   Shellfish Allergy Anaphylaxis   Chocolate Flavor Hives   Sulfa Antibiotics Other (See Comments)   Latex Rash    Antimicrobials this admission: 7/16 vancomycin >>   Dose adjustments this admission:   Microbiology results: 7/16 BCx: sent  Thank you for allowing pharmacy to be a part of this patient's care.  8/16, PharmD Clinical Pharmacist 10/03/2020 5:14 PM

## 2020-10-03 NOTE — ED Triage Notes (Signed)
Pt to ED [facility] from [facility], was taking a nap today and woke up with redness and swelling to right side of face Swelling and redness noted around right eye as well.  White spot noted to right cheek as well Pt in NAD  Daughter states saw pt yesterday and no abnormalities noted to facial area

## 2020-10-04 ENCOUNTER — Encounter: Payer: Self-pay | Admitting: Internal Medicine

## 2020-10-04 DIAGNOSIS — I4821 Permanent atrial fibrillation: Secondary | ICD-10-CM

## 2020-10-04 DIAGNOSIS — I4891 Unspecified atrial fibrillation: Secondary | ICD-10-CM | POA: Diagnosis present

## 2020-10-04 DIAGNOSIS — Z8673 Personal history of transient ischemic attack (TIA), and cerebral infarction without residual deficits: Secondary | ICD-10-CM | POA: Diagnosis not present

## 2020-10-04 DIAGNOSIS — G4700 Insomnia, unspecified: Secondary | ICD-10-CM | POA: Diagnosis present

## 2020-10-04 DIAGNOSIS — K047 Periapical abscess without sinus: Secondary | ICD-10-CM

## 2020-10-04 DIAGNOSIS — G309 Alzheimer's disease, unspecified: Secondary | ICD-10-CM | POA: Diagnosis present

## 2020-10-04 DIAGNOSIS — Z91013 Allergy to seafood: Secondary | ICD-10-CM | POA: Diagnosis not present

## 2020-10-04 DIAGNOSIS — E039 Hypothyroidism, unspecified: Secondary | ICD-10-CM | POA: Diagnosis present

## 2020-10-04 DIAGNOSIS — Z79890 Hormone replacement therapy (postmenopausal): Secondary | ICD-10-CM | POA: Diagnosis not present

## 2020-10-04 DIAGNOSIS — Z79899 Other long term (current) drug therapy: Secondary | ICD-10-CM | POA: Diagnosis not present

## 2020-10-04 DIAGNOSIS — F0280 Dementia in other diseases classified elsewhere without behavioral disturbance: Secondary | ICD-10-CM

## 2020-10-04 DIAGNOSIS — L03211 Cellulitis of face: Secondary | ICD-10-CM

## 2020-10-04 DIAGNOSIS — Z880 Allergy status to penicillin: Secondary | ICD-10-CM | POA: Diagnosis not present

## 2020-10-04 DIAGNOSIS — K219 Gastro-esophageal reflux disease without esophagitis: Secondary | ICD-10-CM | POA: Diagnosis present

## 2020-10-04 DIAGNOSIS — Z20822 Contact with and (suspected) exposure to covid-19: Secondary | ICD-10-CM | POA: Diagnosis present

## 2020-10-04 DIAGNOSIS — Z66 Do not resuscitate: Secondary | ICD-10-CM | POA: Diagnosis present

## 2020-10-04 DIAGNOSIS — I10 Essential (primary) hypertension: Secondary | ICD-10-CM | POA: Diagnosis present

## 2020-10-04 DIAGNOSIS — Z7901 Long term (current) use of anticoagulants: Secondary | ICD-10-CM | POA: Diagnosis not present

## 2020-10-04 LAB — CBC
HCT: 40.2 % (ref 36.0–46.0)
Hemoglobin: 13.6 g/dL (ref 12.0–15.0)
MCH: 31.9 pg (ref 26.0–34.0)
MCHC: 33.8 g/dL (ref 30.0–36.0)
MCV: 94.4 fL (ref 80.0–100.0)
Platelets: 157 10*3/uL (ref 150–400)
RBC: 4.26 MIL/uL (ref 3.87–5.11)
RDW: 13.6 % (ref 11.5–15.5)
WBC: 10 10*3/uL (ref 4.0–10.5)
nRBC: 0 % (ref 0.0–0.2)

## 2020-10-04 LAB — BASIC METABOLIC PANEL
Anion gap: 7 (ref 5–15)
BUN: 30 mg/dL — ABNORMAL HIGH (ref 8–23)
CO2: 25 mmol/L (ref 22–32)
Calcium: 8.8 mg/dL — ABNORMAL LOW (ref 8.9–10.3)
Chloride: 107 mmol/L (ref 98–111)
Creatinine, Ser: 0.73 mg/dL (ref 0.44–1.00)
GFR, Estimated: >60 mL/min (ref >60)
Glucose, Bld: 129 mg/dL — ABNORMAL HIGH (ref 70–99)
Potassium: 3.9 mmol/L (ref 3.5–5.1)
Sodium: 139 mmol/L (ref 135–145)

## 2020-10-04 MED ORDER — CLINDAMYCIN PHOSPHATE 600 MG/50ML IV SOLN
600.0000 mg | Freq: Three times a day (TID) | INTRAVENOUS | Status: DC
  Administered 2020-10-04 – 2020-10-05 (×3): 600 mg via INTRAVENOUS
  Filled 2020-10-04 (×4): qty 50

## 2020-10-04 NOTE — Progress Notes (Signed)
[facility]  - [place] at [facility]   PATIENT NAME: Gabrielle Navarro    MR#:  [id number]  DATE OF BIRTH:  03-Nov-1927  SUBJECTIVE:  men with redness and swelling of right cheek. Daughter at bedside. Patient has dementia. Swelling redness much improved from daughter per daughter  REVIEW OF SYSTEMS:   Review of Systems  Unable to perform ROS: Dementia  Tolerating Diet: Tolerating PT:   DRUG ALLERGIES:   Allergies  Allergen Reactions   Amoxicillin Anaphylaxis    Patient has tolerated cefdinir within the last year.  See full allergy assessment progress note from 10/9   Shellfish Allergy Anaphylaxis   Chocolate Flavor Hives   Sulfa Antibiotics Other (See Comments)   Latex Rash    VITALS:  Blood pressure 113/60, pulse 98, temperature 98.6 F (37 C), temperature source Oral, resp. rate 16, height 5\' 5"  (1.651 m), weight 52.2 kg, SpO2 93 %.  PHYSICAL EXAMINATION:   Physical Exam  GENERAL:  87 y.o.-year-old patient lying in the bed with no acute distress.   On 10/03/2020  10/04/2020    LUNGS: Normal breath sounds bilaterally, no wheezing, rales, rhonchi. No use of accessory muscles of respiration.  CARDIOVASCULAR: S1, S2 normal. No murmurs, rubs, or gallops.  EXTREMITIES: No cyanosis, clubbing or edema b/l.    NEUROLOGIC: nonfocal PSYCHIATRIC:  patient is alert  SKIN: No obvious rash, lesion, or ulcer.   LABORATORY PANEL:  CBC Recent Labs  Lab 10/04/20 0434  WBC 10.0  HGB 13.6  HCT 40.2  PLT 157    Chemistries  Recent Labs  Lab 10/04/20 0434  NA 139  K 3.9  CL 107  CO2 25  GLUCOSE 129*  BUN 30*  CREATININE 0.73  CALCIUM 8.8*   Cardiac Enzymes No results for input(s): TROPONINI in the last 168 hours. RADIOLOGY:  CT Maxillofacial W Contrast  Result Date: 10/03/2020 CLINICAL DATA:  Cellulitis, right facial. EXAM: CT MAXILLOFACIAL WITH CONTRAST TECHNIQUE: Multidetector CT imaging of the maxillofacial structures was performed with intravenous  contrast. Multiplanar CT image reconstructions were also generated. CONTRAST:  45mL OMNIPAQUE IOHEXOL 300 MG/ML  SOLN COMPARISON:  CT head without contrast 09/06/2019 FINDINGS: Osseous: Focal lucency is noted about the root of the right canine tooth. There is breech of the cortex with adjacent subperiosteal abscess measuring up to 12 mm in cephalo caudad dimension. No other focal dental abscess is present. No focal osseous lesions are present. No acute fracture is present. Orbits: Right periorbital soft tissue swelling is present. There is no postseptal inflammatory change. Bilateral lens replacements are present. Globes are unremarkable. Orbits are otherwise within normal limits. Sinuses: The paranasal sinuses and mastoid air cells are clear. Soft tissues: Extensive inflammatory changes extend from the subperiosteal abscess. Soft tissue inflammation noted lateral and inferior to the orbit extending over the malar eminence. No discrete drainable fluid collection is present. Inflammatory changes extend superficially to the level of the mandible. Reactive type lymph nodes are present bilaterally. No necrotic nodes are present. Limited intracranial: Remote left occipital lobe infarct again noted. No acute intracranial abnormality. IMPRESSION: 1. Focal lucency about the root of the right canine tooth with adjacent subperiosteal abscess measuring up to 12 mm in cephalo caudad dimension. 2. Extensive inflammatory changes extend from the subperiosteal abscess lateral and inferior to the orbit extending over the malar eminence. 3. No discrete drainable fluid collection. 4. Reactive type lymph nodes bilaterally. 5. Remote left occipital lobe infarct. Electronically Signed   By: 09/08/2019.D.  On: 10/03/2020 16:20   MR ORBITS W WO CONTRAST  Result Date: 10/03/2020 CLINICAL DATA:  Initial evaluation for orbital cellulitis. EXAM: MRI OF THE ORBITS WITHOUT AND WITH CONTRAST TECHNIQUE: Multiplanar, multi-echo  pulse sequences of the orbits and surrounding structures were acquired including fat saturation techniques, before and after intravenous contrast administration. CONTRAST:  73mL GADAVIST GADOBUTROL 1 MMOL/ML IV SOLN COMPARISON:  Prior CT from 10/03/2020. FINDINGS: Orbits: Globes are symmetric in size with normal morphology bilaterally. Patient status post bilateral ocular lens replacement. Optic nerves symmetric and within normal limits. Extra-ocular muscles symmetric and normal. Intraconal and extraconal fat maintained at this time. Lacrimal glands within normal limits. No abnormality about the orbital apices or cavernous sinus. Superior orbital veins within normal limits. Optic chiasm normally situated within the suprasellar cistern. No sellar or suprasellar mass. No evidence for intraorbital or postseptal cellulitis Visualized sinuses: Mild mucoperiosteal thickening noted within the ethmoidal air cells and maxillary sinuses. Visualized paranasal sinuses are otherwise clear. Visualized mastoids largely clear. Soft tissues: Extensive soft tissue swelling, edema, and enhancement seen involving the partially visualized right facial soft tissues, consistent with acute infection/cellulitis. Involvement of the right preseptal soft tissues without visible postseptal or intraorbital extension. Involvement of the visualized right retro antral fat and infratemporal fossa. No visible collections evident by MRI. Previously seen odontogenic abscess not well seen on this exam. Limited intracranial: No evidence for intracranial spread of infection. Age-related cerebral atrophy with mild chronic microvascular ischemic disease. Chronic left PCA distribution infarct noted. Additional small right cerebellar infarct noted as well. IMPRESSION: 1. Extensive soft tissue swelling and enhancement involving the partially visualized right facial soft tissues, consistent with acute infection/cellulitis. Involvement of the right preseptal soft  tissues without visible postseptal or intraorbital extension at this time. Previously identified odontogenic abscess not well seen on this exam. 2. No evidence for intracranial spread of infection. 3. Age-related cerebral atrophy with mild chronic microvascular ischemic disease, with chronic left PCA and right cerebellar infarcts. Electronically Signed   By: Rise Mu M.D.   On: 10/03/2020 19:44   ASSESSMENT AND PLAN:  Gabrielle Navarro is a 87 y.o. female with medical history significant for hypertension, atrial fibrillation, Alzheimer's dementia, hypothyroid, insomnia, presents emergency department for chief concerns of redness on her face. Daughter, Elease Hashimoto, at bedside, went to see patient yesterday at 10/02/20 and she reports that her mother did not have redness on her face and or surrounding her eyes.  Right facial cellulitis due to subperiosteal dental abscess right canine tooth --Narrow down to IV Clindamycin 600 mg  q 8 hourly -- patient will need to see dentist which daughter is going to make appointment tomorrow. -- PRN pain med --CT maxillofacial with contrast was read as focal lucency about the root of the right canine tooth with adjacent subperiosteal abscess measuring 12 mm in cephalocaudad dimension; extensive inflammatory changes extend from the subperiosteal abscess lateral and inferior to the orbit extending over the malar eminence; no discrete drainable fluid collection.  Hypothyroidism -- continue Synthroid  atrial fibrillation -- on PO eliquis  HTN -- losartan, carvedilol  Genella Rife -- PPI  Alzheimer's dementia -- continue Namenda   Procedures: Family communication :dter at bedside Consults : CODE STATUS: FULL DVT Prophylaxis :eliquis Level of care: Med-Surg Status is: Inpatient  Remains inpatient appropriate because:Inpatient level of care appropriate due to severity of illness  Dispo: The patient is from: ALF              Anticipated d/c is to: ALF  Patient currently is not medically stable to d/c.   Difficult to place patient No   will continue to monitor.     TOTAL TIME TAKING CARE OF THIS PATIENT: 25 minutes.  >50% time spent on counselling and coordination of care  Note: This dictation was prepared with Dragon dictation along with smaller phrase technology. Any transcriptional errors that result from this process are unintentional.  Enedina Finner M.D    [facility]   CC: Primary care physician; Patient, No Pcp Per (Inactive) Patient ID: Gabrielle Navarro, female   DOB: Jan 25, 1928, 87 y.o.   MRN: [id number]

## 2020-10-05 DIAGNOSIS — E039 Hypothyroidism, unspecified: Secondary | ICD-10-CM

## 2020-10-05 DIAGNOSIS — L03211 Cellulitis of face: Secondary | ICD-10-CM | POA: Diagnosis not present

## 2020-10-05 DIAGNOSIS — G309 Alzheimer's disease, unspecified: Secondary | ICD-10-CM | POA: Diagnosis not present

## 2020-10-05 DIAGNOSIS — K047 Periapical abscess without sinus: Secondary | ICD-10-CM | POA: Diagnosis not present

## 2020-10-05 LAB — CREATININE, SERUM
Creatinine, Ser: 0.84 mg/dL (ref 0.44–1.00)
GFR, Estimated: >60 mL/min (ref >60)

## 2020-10-05 MED ORDER — CLINDAMYCIN HCL 300 MG PO CAPS: 600.0000 mg | ORAL_CAPSULE | Freq: Three times a day (TID) | ORAL | 0 refills | Status: AC

## 2020-10-05 MED ORDER — SODIUM CHLORIDE 0.9 % IV SOLN
INTRAVENOUS | Status: DC | PRN
  Administered 2020-10-05: 1000 mL via INTRAVENOUS

## 2020-10-05 MED ORDER — CLINDAMYCIN HCL 300 MG PO CAPS: 600.0000 mg | ORAL_CAPSULE | Freq: Three times a day (TID) | ORAL | 0 refills | Status: DC

## 2020-10-05 MED ORDER — CLINDAMYCIN HCL 150 MG PO CAPS
600.0000 mg | ORAL_CAPSULE | Freq: Three times a day (TID) | ORAL | Status: DC
  Administered 2020-10-05: 600 mg via ORAL
  Filled 2020-10-05 (×3): qty 4

## 2020-10-05 NOTE — Discharge Instructions (Signed)
Pt to f/u with dentist iin 2-3 days. Daughter to make appt F/u with PCP

## 2020-10-05 NOTE — Discharge Summary (Signed)
[facility] - [place] at [facility]   PATIENT NAME: Gabrielle Navarro    MR#:  [id number]  DATE OF BIRTH:  19-Aug-1927  DATE OF ADMISSION:  10/03/2020 ADMITTING PHYSICIAN: Enedina Finner, MD  DATE OF DISCHARGE: 10/05/2020  PRIMARY CARE PHYSICIAN: Patient, No Pcp Per (Inactive)    ADMISSION DIAGNOSIS:  Cellulitis [L03.90] Facial cellulitis [L03.211]  DISCHARGE DIAGNOSIS:  Right facial cellulitis due to right canine periosteal abscess  SECONDARY DIAGNOSIS:   Past Medical History:  Diagnosis Date   Atrial fibrillation (HCC)    Dementia (HCC)    GERD (gastroesophageal reflux disease)     HOSPITAL COURSE:   Gabrielle Navarro is a 87 y.o. female with medical history significant for hypertension, atrial fibrillation, Alzheimer's dementia, hypothyroid, insomnia, presents emergency department for chief concerns of redness on her face. Daughter, Elease Hashimoto, at bedside, went to see patient yesterday at 10/02/20 and she reports that her mother did not have redness on her face and or surrounding her eyes.   Right facial cellulitis due to subperiosteal dental abscess right canine tooth --Narrow down to IV Clindamycin 600 mg  q 8 hourly--change to po for 7 more days -- patient will need to see dentist which daughter is going to make appointment tomorrow. -- PRN pain med --CT maxillofacial with contrast was read as focal lucency about the root of the right canine tooth with adjacent subperiosteal abscess measuring 12 mm in cephalocaudad dimension; extensive inflammatory changes extend from the subperiosteal abscess lateral and inferior to the orbit extending over the malar eminence; no discrete drainable fluid collection.   Hypothyroidism -- continue Synthroid   atrial fibrillation -- on PO eliquis   HTN -- losartan, carvedilol   Genella Rife -- PPI   Alzheimer's dementia -- continue Namenda   Family communication :dter on the phone Consults :none CODE STATUS: FULL DVT Prophylaxis  :eliquis Level of care: Med-Surg Status is: Inpatient     Dispo: The patient is from: ALF              Anticipated d/c is to: ALF--[facility] today              Patient currently is  medically improving stable to d/c.              Difficult to place patient No     CONSULTS OBTAINED:    DRUG ALLERGIES:   Allergies  Allergen Reactions   Amoxicillin Anaphylaxis    Patient has tolerated cefdinir within the last year.  See full allergy assessment progress note from 10/9   Shellfish Allergy Anaphylaxis   Chocolate Flavor Hives   Sulfa Antibiotics Other (See Comments)   Latex Rash    DISCHARGE MEDICATIONS:   Allergies as of 10/05/2020       Reactions   Amoxicillin Anaphylaxis   Patient has tolerated cefdinir within the last year.  See full allergy assessment progress note from 10/9   Shellfish Allergy Anaphylaxis   Chocolate Flavor Hives   Sulfa Antibiotics Other (See Comments)   Latex Rash        Medication List     TAKE these medications    apixaban 2.5 MG Tabs tablet Commonly known as: ELIQUIS Take 2.5 mg by mouth 2 (two) times daily.   carvedilol 6.25 MG tablet Commonly known as: COREG Take 6.25 mg by mouth 2 (two) times daily with a meal.   clindamycin 300 MG capsule Commonly known as: CLEOCIN Take 2 capsules (600 mg total) by mouth 3 (three) times daily  for 7 days.   lactose free nutrition Liqd Take 237 mLs by mouth 3 (three) times daily between meals.   levothyroxine 25 MCG tablet Commonly known as: SYNTHROID Take 25 mcg by mouth daily before breakfast.   losartan 50 MG tablet Commonly known as: COZAAR Take 50 mg by mouth 2 (two) times a day.   memantine 28 MG Cp24 24 hr capsule Commonly known as: NAMENDA XR Take 28 mg by mouth daily.   mirtazapine 7.5 MG tablet Commonly known as: REMERON Take 7.5 mg by mouth at bedtime.   omeprazole 10 MG capsule Commonly known as: PRILOSEC Take 10 mg by mouth daily.   traZODone 100 MG tablet Commonly  known as: DESYREL Take 100 mg by mouth at bedtime.        If you experience worsening of your admission symptoms, develop shortness of breath, life threatening emergency, suicidal or homicidal thoughts you must seek medical attention immediately by calling 911 or calling your MD immediately  if symptoms less severe.  You Must read complete instructions/literature along with all the possible adverse reactions/side effects for all the Medicines you take and that have been prescribed to you. Take any new Medicines after you have completely understood and accept all the possible adverse reactions/side effects.   Please note  You were cared for by a hospitalist during your hospital stay. If you have any questions about your discharge medications or the care you received while you were in the hospital after you are discharged, you can call the unit and asked to speak with the hospitalist on call if the hospitalist that took care of you is not available. Once you are discharged, your primary care physician will handle any further medical issues. Please note that NO REFILLS for any discharge medications will be authorized once you are discharged, as it is imperative that you return to your primary care physician (or establish a relationship with a primary care physician if you do not have one) for your aftercare needs so that they can reassess your need for medications and monitor your lab values. Today   SUBJECTIVE   No fever eating well Has dementia at baseline  VITAL SIGNS:  Blood pressure 112/60, pulse 91, temperature 98.7 F (37.1 C), temperature source Oral, resp. rate 20, height 5\' 5"  (1.651 m), weight 52.2 kg, SpO2 94 %.  I/O:   Intake/Output Summary (Last 24 hours) at 10/05/2020 0739 Last data filed at 10/05/2020 0531 Gross per 24 hour  Intake 1224.5 ml  Output --  Net 1224.5 ml    PHYSICAL EXAMINATION:  GENERAL:  87 y.o.-year-old patient lying in the bed with no acute distress.    10/05/2020   LUNGS: Normal breath sounds bilaterally, no wheezing, rales,rhonchi or crepitation. No use of accessory muscles of respiration.  CARDIOVASCULAR: S1, S2 normal. No murmurs, rubs, or gallops.  ABDOMEN: Soft, non-tender, non-distended. Bowel sounds present. No organomegaly or mass.  EXTREMITIES: No pedal edema, cyanosis, or clubbing.  NEUROLOGIC:non focal SKIN: as above  DATA REVIEW:   CBC  Recent Labs  Lab 10/04/20 0434  WBC 10.0  HGB 13.6  HCT 40.2  PLT 157    Chemistries  Recent Labs  Lab 10/04/20 0434 10/05/20 0452  NA 139  --   K 3.9  --   CL 107  --   CO2 25  --   GLUCOSE 129*  --   BUN 30*  --   CREATININE 0.73 0.84  CALCIUM 8.8*  --  Microbiology Results   Recent Results (from the past 240 hour(s))  Blood culture (routine x 2)     Status: None (Preliminary result)   Collection Time: 10/03/20  4:45 PM   Specimen: BLOOD  Result Value Ref Range Status   Specimen Description BLOOD LEFT HAND  Final   Special Requests   Final    BOTTLES DRAWN AEROBIC AND ANAEROBIC Blood Culture results may not be optimal due to an excessive volume of blood received in culture bottles   Culture   Final    NO GROWTH < 24 HOURS Performed at [facility], [address] [place], [place] [address]    Report Status PENDING  Incomplete  Blood culture (routine x 2)     Status: None (Preliminary result)   Collection Time: 10/03/20  4:45 PM   Specimen: BLOOD  Result Value Ref Range Status   Specimen Description BLOOD LEFT AC  Final   Special Requests   Final    BOTTLES DRAWN AEROBIC AND ANAEROBIC Blood Culture results may not be optimal due to an excessive volume of blood received in culture bottles   Culture   Final    NO GROWTH < 24 HOURS Performed at [facility], [address], [place], [place] [address]    Report Status PENDING  Incomplete  Resp Panel by RT-PCR (Flu A&B, Covid) Nasopharyngeal Swab     Status: None   Collection Time:  10/03/20  4:45 PM   Specimen: Nasopharyngeal Swab; Nasopharyngeal(NP) swabs in vial transport medium  Result Value Ref Range Status   SARS Coronavirus 2 by RT PCR NEGATIVE NEGATIVE Final    Comment: (NOTE) SARS-CoV-2 target nucleic acids are NOT DETECTED.  The SARS-CoV-2 RNA is generally detectable in upper respiratory specimens during the acute phase of infection. The lowest concentration of SARS-CoV-2 viral copies this assay can detect is 138 copies/mL. A negative result does not preclude SARS-Cov-2 infection and should not be used as the sole basis for treatment or other patient management decisions. A negative result may occur with  improper specimen collection/handling, submission of specimen other than nasopharyngeal swab, presence of viral mutation(s) within the areas targeted by this assay, and inadequate number of viral copies(<138 copies/mL). A negative result must be combined with clinical observations, patient history, and epidemiological information. The expected result is Negative.  Fact Sheet for Patients:  BloggerCourse.com  Fact Sheet for Healthcare Providers:  SeriousBroker.it  This test is no t yet approved or cleared by the [place] FDA and  has been authorized for detection and/or diagnosis of SARS-CoV-2 by FDA under an Emergency Use Authorization (EUA). This EUA will remain  in effect (meaning this test can be used) for the duration of the COVID-19 declaration under Section 564(b)(1) of the Act, 21 U.S.C.section 360bbb-3(b)(1), unless the authorization is terminated  or revoked sooner.       Influenza A by PCR NEGATIVE NEGATIVE Final   Influenza B by PCR NEGATIVE NEGATIVE Final    Comment: (NOTE) The Xpert Xpress SARS-CoV-2/FLU/RSV plus assay is intended as an aid in the diagnosis of influenza from Nasopharyngeal swab specimens and should not be used as a sole basis for treatment. Nasal washings  and aspirates are unacceptable for Xpert Xpress SARS-CoV-2/FLU/RSV testing.  Fact Sheet for Patients: BloggerCourse.com  Fact Sheet for Healthcare Providers: SeriousBroker.it  This test is not yet approved or cleared by the [place] FDA and has been authorized for detection and/or diagnosis of SARS-CoV-2 by FDA under an Emergency  Use Authorization (EUA). This EUA will remain in effect (meaning this test can be used) for the duration of the COVID-19 declaration under Section 564(b)(1) of the Act, 21 U.S.C. section 360bbb-3(b)(1), unless the authorization is terminated or revoked.  Performed at [facility], [address] [place], [place] [address]     RADIOLOGY:  CT Maxillofacial W Contrast  Result Date: 10/03/2020 CLINICAL DATA:  Cellulitis, right facial. EXAM: CT MAXILLOFACIAL WITH CONTRAST TECHNIQUE: Multidetector CT imaging of the maxillofacial structures was performed with intravenous contrast. Multiplanar CT image reconstructions were also generated. CONTRAST:  75mL OMNIPAQUE IOHEXOL 300 MG/ML  SOLN COMPARISON:  CT head without contrast 09/06/2019 FINDINGS: Osseous: Focal lucency is noted about the root of the right canine tooth. There is breech of the cortex with adjacent subperiosteal abscess measuring up to 12 mm in cephalo caudad dimension. No other focal dental abscess is present. No focal osseous lesions are present. No acute fracture is present. Orbits: Right periorbital soft tissue swelling is present. There is no postseptal inflammatory change. Bilateral lens replacements are present. Globes are unremarkable. Orbits are otherwise within normal limits. Sinuses: The paranasal sinuses and mastoid air cells are clear. Soft tissues: Extensive inflammatory changes extend from the subperiosteal abscess. Soft tissue inflammation noted lateral and inferior to the orbit extending over the malar eminence. No discrete  drainable fluid collection is present. Inflammatory changes extend superficially to the level of the mandible. Reactive type lymph nodes are present bilaterally. No necrotic nodes are present. Limited intracranial: Remote left occipital lobe infarct again noted. No acute intracranial abnormality. IMPRESSION: 1. Focal lucency about the root of the right canine tooth with adjacent subperiosteal abscess measuring up to 12 mm in cephalo caudad dimension. 2. Extensive inflammatory changes extend from the subperiosteal abscess lateral and inferior to the orbit extending over the malar eminence. 3. No discrete drainable fluid collection. 4. Reactive type lymph nodes bilaterally. 5. Remote left occipital lobe infarct. Electronically Signed   By: Marin Roberts M.D.   On: 10/03/2020 16:20   MR ORBITS W WO CONTRAST  Result Date: 10/03/2020 CLINICAL DATA:  Initial evaluation for orbital cellulitis. EXAM: MRI OF THE ORBITS WITHOUT AND WITH CONTRAST TECHNIQUE: Multiplanar, multi-echo pulse sequences of the orbits and surrounding structures were acquired including fat saturation techniques, before and after intravenous contrast administration. CONTRAST:  5mL GADAVIST GADOBUTROL 1 MMOL/ML IV SOLN COMPARISON:  Prior CT from 10/03/2020. FINDINGS: Orbits: Globes are symmetric in size with normal morphology bilaterally. Patient status post bilateral ocular lens replacement. Optic nerves symmetric and within normal limits. Extra-ocular muscles symmetric and normal. Intraconal and extraconal fat maintained at this time. Lacrimal glands within normal limits. No abnormality about the orbital apices or cavernous sinus. Superior orbital veins within normal limits. Optic chiasm normally situated within the suprasellar cistern. No sellar or suprasellar mass. No evidence for intraorbital or postseptal cellulitis Visualized sinuses: Mild mucoperiosteal thickening noted within the ethmoidal air cells and maxillary sinuses. Visualized  paranasal sinuses are otherwise clear. Visualized mastoids largely clear. Soft tissues: Extensive soft tissue swelling, edema, and enhancement seen involving the partially visualized right facial soft tissues, consistent with acute infection/cellulitis. Involvement of the right preseptal soft tissues without visible postseptal or intraorbital extension. Involvement of the visualized right retro antral fat and infratemporal fossa. No visible collections evident by MRI. Previously seen odontogenic abscess not well seen on this exam. Limited intracranial: No evidence for intracranial spread of infection. Age-related cerebral atrophy with mild chronic microvascular ischemic disease. Chronic left PCA distribution  infarct noted. Additional small right cerebellar infarct noted as well. IMPRESSION: 1. Extensive soft tissue swelling and enhancement involving the partially visualized right facial soft tissues, consistent with acute infection/cellulitis. Involvement of the right preseptal soft tissues without visible postseptal or intraorbital extension at this time. Previously identified odontogenic abscess not well seen on this exam. 2. No evidence for intracranial spread of infection. 3. Age-related cerebral atrophy with mild chronic microvascular ischemic disease, with chronic left PCA and right cerebellar infarcts. Electronically Signed   By: Rise MuBenjamin  McClintock M.D.   On: 10/03/2020 19:44     CODE STATUS:     Code Status Orders  (From admission, onward)           Start     Ordered   10/03/20 1702  Full code  Continuous        10/03/20 1702           Code Status History     Date Active Date Inactive Code Status Order ID Comments User Context   09/07/2019 0111 09/08/2019 0154 Full Code [id number]  Rometta EmeryGarba, Mohammad L, MD ED      Advance Directive Documentation    Flowsheet Row Most Recent Value  Type of Advance Directive Healthcare Power of Attorney  Pre-existing out of facility DNR order  (yellow form or pink MOST form) --  "MOST" Form in Place? --        TOTAL TIME TAKING CARE OF THIS PATIENT: 35 minutes.    Enedina FinnerSona Saniyya Gau M.D  [facility]    CC: Primary care physician; Patient, No Pcp Per (Inactive)

## 2020-10-05 NOTE — TOC Transition Note (Signed)
Transition of Care [facility]) - CM/SW Discharge Note   Patient Details  Name: Gabrielle Navarro MRN: [id number] Date of Birth: 15-Nov-1927  Transition of Care [facility]) CM/SW Contact:  Margarito Liner, LCSW Phone Number: 10/05/2020, 1:14 PM   Clinical Narrative:   Patient has orders to discharge back to [facility]. [facility] is able to accept referral for PT, OT, and RN. No further concerns. CSW signing off.  Final next level of care: Assisted Living (with home health) Barriers to Discharge: No Barriers Identified   Patient Goals and CMS Choice     Choice offered to / list presented to : Adult Children  Discharge Placement                Patient to be transferred to facility by: Daughter will transport Name of family member notified: [name] Patient and family notified of of transfer: 10/05/20  Discharge Plan and Services                          HH Arranged: RN, PT, OT [facility] Agency: [facility] Date [facility] [place] Agency Contacted: 10/05/20   Representative spoke with at [facility] Agency: Meg  Social Determinants of Health (SDOH) Interventions     Readmission Risk Interventions No flowsheet data found.

## 2020-10-05 NOTE — NC FL2 (Addendum)
[place] MEDICAID [id number] LEVEL OF CARE SCREENING TOOL     IDENTIFICATION  Patient Name: Gabrielle Navarro Birthdate: May 31, 1927 Sex: female Admission Date (Current Location): 10/03/2020  [facility] and [place] Number:  Chiropodist and Address:  [facility], [address], [place], [place] [address]      Provider Number: [id number]  Attending Physician Name and Address:  Enedina Finner, MD  Relative Name and Phone Number:       Current Level of Care: Hospital Recommended Level of Care: Assisted Living Facility (with PT, OT, RN) Prior Approval Number:    Date Approved/Denied:   PASRR Number:    Discharge Plan: Other (Comment) (ALF with PT, OT, RN)    Current Diagnoses: Patient Active Problem List   Diagnosis Date Noted   Facial cellulitis    Dental abscess    Cellulitis 10/03/2020   A-fib (HCC) 09/06/2019   Dementia (HCC) 09/06/2019   HOH (hard of hearing) 09/06/2019   GERD (gastroesophageal reflux disease) 09/06/2019   Benign essential HTN 09/06/2019   Hypothyroidism 09/06/2019   Syncope and collapse 09/06/2019    Orientation RESPIRATION BLADDER Height & Weight     Self, Situation, Place  Normal Incontinent Weight: 115 lb (52.2 kg) Height:  5\' 5"  (165.1 cm)  BEHAVIORAL SYMPTOMS/MOOD NEUROLOGICAL BOWEL NUTRITION STATUS     (None) Continent Diet (Heart healthy)  AMBULATORY STATUS COMMUNICATION OF NEEDS Skin     Verbally Other (Comment) (Cellulitis on right side of face.)                       Personal Care Assistance Level of Assistance              Functional Limitations Info  Sight, Hearing, Speech Sight Info: Adequate Hearing Info: Impaired Speech Info: Adequate    SPECIAL CARE FACTORS FREQUENCY  PT (By licensed PT), OT (By licensed OT)     PT Frequency: 3 x week OT Frequency: 3 x week            Contractures Contractures Info: Not present    Additional Factors Info  Code Status, Allergies Code Status Info:  Full code Allergies Info: Amoxicillin, Shellfish Allergy, Chocolate Flavor, Sulfa Antibiotics, Latex           Current Medications (10/05/2020):  This is the current hospital active medication list Current Facility-Administered Medications  Medication Dose Route Frequency Provider Last Rate Last Admin   0.9 %  sodium chloride infusion   Intravenous PRN 10/07/2020, MD   Stopped at 10/05/20 0515   acetaminophen (TYLENOL) tablet 1,000 mg  1,000 mg Oral Q6H PRN Cox, Amy N, DO   1,000 mg at 10/04/20 10/06/20   Or   acetaminophen (TYLENOL) suppository 650 mg  650 mg Rectal Q6H PRN Cox, Amy N, DO       apixaban (ELIQUIS) tablet 2.5 mg  2.5 mg Oral BID Cox, Amy N, DO   2.5 mg at 10/05/20 1122   carvedilol (COREG) tablet 6.25 mg  6.25 mg Oral BID WC Cox, Amy N, DO   6.25 mg at 10/05/20 1122   clindamycin (CLEOCIN) capsule 600 mg  600 mg Oral TID 10/07/20, MD   600 mg at 10/05/20 1122   lactose free nutrition (Boost) liquid 237 mL  237 mL Oral TID BM Cox, Amy N, DO   237 mL at 10/05/20 1123   levothyroxine (SYNTHROID) tablet 25 mcg  25 mcg Oral Q0600 Cox, Amy N, DO  25 mcg at 10/05/20 0504   losartan (COZAAR) tablet 50 mg  50 mg Oral Daily Cox, Amy N, DO   50 mg at 10/05/20 1122   memantine (NAMENDA XR) 24 hr capsule 28 mg  28 mg Oral Daily Cox, Amy N, DO   28 mg at 10/05/20 1122   mirtazapine (REMERON) tablet 7.5 mg  7.5 mg Oral QHS Cox, Amy N, DO   7.5 mg at 10/04/20 2030   ondansetron (ZOFRAN) tablet 4 mg  4 mg Oral Q6H PRN Cox, Amy N, DO       Or   ondansetron (ZOFRAN) injection 4 mg  4 mg Intravenous Q6H PRN Cox, Amy N, DO       pantoprazole (PROTONIX) EC tablet 40 mg  40 mg Oral Daily Cox, Amy N, DO   40 mg at 10/05/20 1122   traZODone (DESYREL) tablet 100 mg  100 mg Oral QHS Cox, Amy N, DO   100 mg at 10/04/20 2030     Discharge Medications: TAKE these medications     apixaban 2.5 MG Tabs tablet Commonly known as: ELIQUIS Take 2.5 mg by mouth 2 (two) times daily.    carvedilol 6.25  MG tablet Commonly known as: COREG Take 6.25 mg by mouth 2 (two) times daily with a meal.    clindamycin 300 MG capsule Commonly known as: CLEOCIN Take 2 capsules (600 mg total) by mouth 3 (three) times daily for 7 days.    lactose free nutrition Liqd Take 237 mLs by mouth 3 (three) times daily between meals.    levothyroxine 25 MCG tablet Commonly known as: SYNTHROID Take 25 mcg by mouth daily before breakfast.    losartan 50 MG tablet Commonly known as: COZAAR Take 50 mg by mouth 2 (two) times a day.    memantine 28 MG Cp24 24 hr capsule Commonly known as: NAMENDA XR Take 28 mg by mouth daily.    mirtazapine 7.5 MG tablet Commonly known as: REMERON Take 7.5 mg by mouth at bedtime.    omeprazole 10 MG capsule Commonly known as: PRILOSEC Take 10 mg by mouth daily.    traZODone 100 MG tablet Commonly known as: DESYREL Take 100 mg by mouth at bedtime.    Relevant Imaging Results:  Relevant Lab Results:   Additional Information SS#: [id number]  Margarito Liner, LCSW

## 2020-10-05 NOTE — Clinical Social Work Note (Addendum)
Gabrielle Navarro at [facility] is aware of plan for discharge today. Faxed discharge summary for her to review. She is requesting home health referral for PT, OT, and RN be made to [facility] (preferred provider). Left voicemail for daughter. Will confirm she is agreeable to this when she calls back. [facility] will be unable to transport back to the facility so will see if daughter can transport when she calls back.  Bryannah Boston, CSW [phone]  12:05 pm: Daughter agreeable to home health referral. Iantha Fallen is reviewing. Faxed FL2 to Gabrielle Navarro at [facility] and left her a voicemail to let her know. Daughter will transport patient back to facility.  Tavon Corriher, CSW [phone]

## 2020-10-08 LAB — CULTURE, BLOOD (ROUTINE X 2)
Culture: NO GROWTH
Culture: NO GROWTH

## 2021-01-01 ENCOUNTER — Emergency Department: Payer: Medicare Other

## 2021-01-01 ENCOUNTER — Other Ambulatory Visit: Payer: Self-pay

## 2021-01-01 ENCOUNTER — Observation Stay: Payer: Medicare Other

## 2021-01-01 ENCOUNTER — Inpatient Hospital Stay
Admission: EM | Admit: 2021-01-01 | Discharge: 2021-01-05 | DRG: 069 | Disposition: A | Discharge status: Skilled Nursing Facility | Payer: Medicare Other | Source: Skilled Nursing Facility | Attending: Internal Medicine | Admitting: Internal Medicine

## 2021-01-01 ENCOUNTER — Encounter: Payer: Self-pay | Admitting: Emergency Medicine

## 2021-01-01 DIAGNOSIS — Z91040 Latex allergy status: Secondary | ICD-10-CM

## 2021-01-01 DIAGNOSIS — R4182 Altered mental status, unspecified: Secondary | ICD-10-CM

## 2021-01-01 DIAGNOSIS — Z882 Allergy status to sulfonamides status: Secondary | ICD-10-CM

## 2021-01-01 DIAGNOSIS — M11261 Other chondrocalcinosis, right knee: Secondary | ICD-10-CM | POA: Diagnosis present

## 2021-01-01 DIAGNOSIS — I10 Essential (primary) hypertension: Secondary | ICD-10-CM | POA: Diagnosis present

## 2021-01-01 DIAGNOSIS — M1120 Other chondrocalcinosis, unspecified site: Secondary | ICD-10-CM

## 2021-01-01 DIAGNOSIS — F0393 Unspecified dementia, unspecified severity, with mood disturbance: Secondary | ICD-10-CM | POA: Diagnosis present

## 2021-01-01 DIAGNOSIS — K219 Gastro-esophageal reflux disease without esophagitis: Secondary | ICD-10-CM | POA: Diagnosis present

## 2021-01-01 DIAGNOSIS — F0394 Unspecified dementia, unspecified severity, with anxiety: Secondary | ICD-10-CM | POA: Diagnosis present

## 2021-01-01 DIAGNOSIS — G459 Transient cerebral ischemic attack, unspecified: Principal | ICD-10-CM | POA: Diagnosis present

## 2021-01-01 DIAGNOSIS — F32A Depression, unspecified: Secondary | ICD-10-CM

## 2021-01-01 DIAGNOSIS — I482 Chronic atrial fibrillation, unspecified: Secondary | ICD-10-CM | POA: Diagnosis present

## 2021-01-01 DIAGNOSIS — Z7901 Long term (current) use of anticoagulants: Secondary | ICD-10-CM

## 2021-01-01 DIAGNOSIS — Z91018 Allergy to other foods: Secondary | ICD-10-CM

## 2021-01-01 DIAGNOSIS — E039 Hypothyroidism, unspecified: Secondary | ICD-10-CM | POA: Diagnosis present

## 2021-01-01 DIAGNOSIS — M25569 Pain in unspecified knee: Secondary | ICD-10-CM | POA: Diagnosis present

## 2021-01-01 DIAGNOSIS — Z20822 Contact with and (suspected) exposure to covid-19: Secondary | ICD-10-CM | POA: Diagnosis present

## 2021-01-01 DIAGNOSIS — Z79899 Other long term (current) drug therapy: Secondary | ICD-10-CM

## 2021-01-01 DIAGNOSIS — Z79890 Hormone replacement therapy (postmenopausal): Secondary | ICD-10-CM

## 2021-01-01 DIAGNOSIS — R739 Hyperglycemia, unspecified: Secondary | ICD-10-CM | POA: Diagnosis present

## 2021-01-01 DIAGNOSIS — M79674 Pain in right toe(s): Secondary | ICD-10-CM

## 2021-01-01 DIAGNOSIS — F0390 Unspecified dementia without behavioral disturbance: Secondary | ICD-10-CM

## 2021-01-01 DIAGNOSIS — R531 Weakness: Secondary | ICD-10-CM

## 2021-01-01 DIAGNOSIS — M25561 Pain in right knee: Secondary | ICD-10-CM

## 2021-01-01 DIAGNOSIS — R7301 Impaired fasting glucose: Secondary | ICD-10-CM | POA: Diagnosis present

## 2021-01-01 DIAGNOSIS — F419 Anxiety disorder, unspecified: Secondary | ICD-10-CM

## 2021-01-01 DIAGNOSIS — R52 Pain, unspecified: Secondary | ICD-10-CM

## 2021-01-01 DIAGNOSIS — Z91013 Allergy to seafood: Secondary | ICD-10-CM

## 2021-01-01 DIAGNOSIS — N179 Acute kidney failure, unspecified: Secondary | ICD-10-CM | POA: Diagnosis present

## 2021-01-01 DIAGNOSIS — E860 Dehydration: Secondary | ICD-10-CM

## 2021-01-01 DIAGNOSIS — Z880 Allergy status to penicillin: Secondary | ICD-10-CM

## 2021-01-01 LAB — URINALYSIS, COMPLETE (UACMP) WITH MICROSCOPIC
Bacteria, UA: NONE SEEN
Bilirubin Urine: NEGATIVE
Glucose, UA: NEGATIVE mg/dL
Hgb urine dipstick: NEGATIVE
Ketones, ur: NEGATIVE mg/dL
Leukocytes,Ua: NEGATIVE
Nitrite: NEGATIVE
Protein, ur: NEGATIVE mg/dL
Specific Gravity, Urine: 1.023 (ref 1.005–1.030)
pH: 6 (ref 5.0–8.0)

## 2021-01-01 LAB — CBC WITH DIFFERENTIAL/PLATELET
Abs Immature Granulocytes: 0.04 10*3/uL (ref 0.00–0.07)
Basophils Absolute: 0 10*3/uL (ref 0.0–0.1)
Basophils Relative: 0 %
Eosinophils Absolute: 0 10*3/uL (ref 0.0–0.5)
Eosinophils Relative: 0 %
HCT: 41.6 % (ref 36.0–46.0)
Hemoglobin: 14.3 g/dL (ref 12.0–15.0)
Immature Granulocytes: 1 %
Lymphocytes Relative: 11 %
Lymphs Abs: 0.9 10*3/uL (ref 0.7–4.0)
MCH: 32.3 pg (ref 26.0–34.0)
MCHC: 34.4 g/dL (ref 30.0–36.0)
MCV: 93.9 fL (ref 80.0–100.0)
Monocytes Absolute: 0.6 10*3/uL (ref 0.1–1.0)
Monocytes Relative: 8 %
Neutro Abs: 6.1 10*3/uL (ref 1.7–7.7)
Neutrophils Relative %: 80 %
Platelets: 177 10*3/uL (ref 150–400)
RBC: 4.43 MIL/uL (ref 3.87–5.11)
RDW: 12.9 % (ref 11.5–15.5)
WBC: 7.6 10*3/uL (ref 4.0–10.5)
nRBC: 0 % (ref 0.0–0.2)

## 2021-01-01 LAB — COMPREHENSIVE METABOLIC PANEL
ALT: 14 U/L (ref 0–44)
AST: 26 U/L (ref 15–41)
Albumin: 4.2 g/dL (ref 3.5–5.0)
Alkaline Phosphatase: 66 U/L (ref 38–126)
Anion gap: 9 (ref 5–15)
BUN: 40 mg/dL — ABNORMAL HIGH (ref 8–23)
CO2: 26 mmol/L (ref 22–32)
Calcium: 9.6 mg/dL (ref 8.9–10.3)
Chloride: 101 mmol/L (ref 98–111)
Creatinine, Ser: 0.87 mg/dL (ref 0.44–1.00)
GFR, Estimated: >60 mL/min (ref >60)
Glucose, Bld: 178 mg/dL — ABNORMAL HIGH (ref 70–99)
Potassium: 3.9 mmol/L (ref 3.5–5.1)
Sodium: 136 mmol/L (ref 135–145)
Total Bilirubin: 1.4 mg/dL — ABNORMAL HIGH (ref 0.3–1.2)
Total Protein: 7 g/dL (ref 6.5–8.1)

## 2021-01-01 LAB — TSH: TSH: 2.246 u[IU]/mL (ref 0.350–4.500)

## 2021-01-01 LAB — RESP PANEL BY RT-PCR (FLU A&B, COVID) ARPGX2
Influenza A by PCR: NEGATIVE
Influenza B by PCR: NEGATIVE
SARS Coronavirus 2 by RT PCR: NEGATIVE

## 2021-01-01 LAB — LIPID PANEL
Cholesterol: 153 mg/dL (ref 0–200)
HDL: 50 mg/dL (ref >40)
LDL Cholesterol: 94 mg/dL (ref 0–99)
Total CHOL/HDL Ratio: 3.1 RATIO
Triglycerides: 47 mg/dL (ref <150)
VLDL: 9 mg/dL (ref 0–40)

## 2021-01-01 LAB — VITAMIN B12: Vitamin B-12: 900 pg/mL (ref 180–914)

## 2021-01-01 LAB — AMMONIA: Ammonia: <10 umol/L (ref 9–35)

## 2021-01-01 MED ORDER — PANTOPRAZOLE SODIUM 40 MG PO TBEC
40.0000 mg | DELAYED_RELEASE_TABLET | Freq: Every day | ORAL | Status: DC
  Administered 2021-01-02 – 2021-01-05 (×4): 40 mg via ORAL
  Filled 2021-01-01 (×4): qty 1

## 2021-01-01 MED ORDER — MEMANTINE HCL ER 28 MG PO CP24
28.0000 mg | ORAL_CAPSULE | Freq: Every day | ORAL | Status: DC
  Administered 2021-01-02 – 2021-01-05 (×4): 28 mg via ORAL
  Filled 2021-01-01 (×4): qty 1

## 2021-01-01 MED ORDER — SENNOSIDES-DOCUSATE SODIUM 8.6-50 MG PO TABS: 1.0000 | ORAL_TABLET | Freq: Every evening | ORAL | Status: DC | PRN

## 2021-01-01 MED ORDER — STROKE: EARLY STAGES OF RECOVERY BOOK: Freq: Once | Status: DC

## 2021-01-01 MED ORDER — LEVOTHYROXINE SODIUM 25 MCG PO TABS
25.0000 ug | ORAL_TABLET | Freq: Every day | ORAL | Status: DC
  Administered 2021-01-02 – 2021-01-05 (×4): 25 ug via ORAL
  Filled 2021-01-01 (×4): qty 1

## 2021-01-01 MED ORDER — LORAZEPAM 0.5 MG PO TABS: 0.5000 mg | ORAL_TABLET | ORAL | Status: DC | PRN

## 2021-01-01 MED ORDER — ACETAMINOPHEN 650 MG RE SUPP: 650.0000 mg | RECTAL | Status: DC | PRN

## 2021-01-01 MED ORDER — SODIUM CHLORIDE 0.9 % IV BOLUS
500.0000 mL | Freq: Once | INTRAVENOUS | Status: AC
  Administered 2021-01-01: 500 mL via INTRAVENOUS

## 2021-01-01 MED ORDER — TRAZODONE HCL 50 MG PO TABS
100.0000 mg | ORAL_TABLET | Freq: Every day | ORAL | Status: DC
  Administered 2021-01-01 – 2021-01-04 (×4): 100 mg via ORAL
  Filled 2021-01-01 (×4): qty 2

## 2021-01-01 MED ORDER — ACETAMINOPHEN 160 MG/5ML PO SOLN
650.0000 mg | ORAL | Status: DC | PRN
  Filled 2021-01-01: qty 20.3

## 2021-01-01 MED ORDER — METOPROLOL TARTRATE 5 MG/5ML IV SOLN: 2.5000 mg | Freq: Four times a day (QID) | INTRAVENOUS | Status: DC | PRN

## 2021-01-01 MED ORDER — APIXABAN 2.5 MG PO TABS
2.5000 mg | ORAL_TABLET | Freq: Two times a day (BID) | ORAL | Status: DC
  Administered 2021-01-01 – 2021-01-05 (×8): 2.5 mg via ORAL
  Filled 2021-01-01 (×9): qty 1

## 2021-01-01 MED ORDER — BOOST PO LIQD
237.0000 mL | Freq: Three times a day (TID) | ORAL | Status: DC
  Administered 2021-01-01 – 2021-01-04 (×8): 237 mL via ORAL
  Filled 2021-01-01: qty 237

## 2021-01-01 MED ORDER — MIRTAZAPINE 15 MG PO TABS
7.5000 mg | ORAL_TABLET | Freq: Every day | ORAL | Status: DC
  Administered 2021-01-01 – 2021-01-04 (×4): 7.5 mg via ORAL
  Filled 2021-01-01 (×4): qty 1

## 2021-01-01 MED ORDER — ACETAMINOPHEN 325 MG PO TABS: 650.0000 mg | ORAL_TABLET | ORAL | Status: DC | PRN

## 2021-01-01 NOTE — ED Notes (Signed)
Pt transported to CT ?

## 2021-01-01 NOTE — ED Notes (Signed)
ED Provider at bedside. 

## 2021-01-01 NOTE — ED Triage Notes (Signed)
Pt comes into the ED via [facility] from [facility] c/o a-fib, confusion, and weakness.  Pt has h/o dementia but is normally ambulatory on her own.  Pt has more weakness than normal and per the daughter not at her baseline.   186 CBG 98.4 oral 98 HR 95% RA 121/59 35 ETCO2

## 2021-01-01 NOTE — ED Provider Notes (Signed)
[facility] Emergency Department Provider Note   ____________________________________________   Event Date/Time   First MD Initiated Contact with Patient 01/01/21 [phone]     (approximate)  I have reviewed the triage vital signs and the nursing notes.   HISTORY  Chief Complaint Atrial Fibrillation, Altered Mental Status, and Weakness  EM caveat dementia, daughter provides majority of history  HPI Gabrielle Navarro is a 87 y.o. female history of dementia atrial fibrillation hypothyroidism urinary tract infection  Patient here for evaluation for seemed to have a little bit of slight confusion as well as increased weakness with difficulty walking today.  Daughter sees the patient regularly, saw her last night for dinner.  She seemed a little bit fatigued a little slower than her typical and a bit tired, but did not have any specific symptoms.  This morning patient got herself dressed but was unable to get herself up and walk with her walker.  Normally she walks well with her walker but today too weak or fatigued to do so  Patient denies being any pain or discomfort.  Daughter reports that she has had similar symptoms in the past with urinary tract infection.  But there is been no report of her having any symptoms of UTI or difficulty urinating fevers etc.  Patient denies any pain chest pain shortness of breath weakness or other concerns other than stating that "how would you feel if you are a 87 year old woman"   Past Medical History:  Diagnosis Date   Atrial fibrillation (HCC)    Dementia (HCC)    GERD (gastroesophageal reflux disease)     Patient Active Problem List   Diagnosis Date Noted   Weakness 01/01/2021   TIA (transient ischemic attack) 01/01/2021   Facial cellulitis    Dental abscess    Cellulitis 10/03/2020   A-fib (HCC) 09/06/2019   Dementia (HCC) 09/06/2019   HOH (hard of hearing) 09/06/2019   GERD (gastroesophageal reflux disease) 09/06/2019    Benign essential HTN 09/06/2019   Hypothyroidism 09/06/2019   Syncope and collapse 09/06/2019    History reviewed. No pertinent surgical history.  Prior to Admission medications   Medication Sig Start Date End Date Taking? Authorizing Provider  apixaban (ELIQUIS) 2.5 MG TABS tablet Take 2.5 mg by mouth 2 (two) times daily.  05/04/18  Yes [provider]  carvedilol (COREG) 6.25 MG tablet Take 6.25 mg by mouth 2 (two) times daily with a meal.  01/01/18  Yes [provider]  lactose free nutrition (BOOST) LIQD Take 237 mLs by mouth 3 (three) times daily between meals.   Yes [provider]  levothyroxine (SYNTHROID, LEVOTHROID) 25 MCG tablet Take 25 mcg by mouth daily before breakfast.  02/09/16  Yes [provider]  losartan (COZAAR) 50 MG tablet Take 50 mg by mouth 2 (two) times a day. 07/02/18  Yes [provider]  memantine (NAMENDA XR) 28 MG CP24 24 hr capsule Take 28 mg by mouth daily.   Yes [provider]  mirtazapine (REMERON) 7.5 MG tablet Take 7.5 mg by mouth at bedtime.   Yes [provider]  omeprazole (PRILOSEC) 10 MG capsule Take 10 mg by mouth daily.  04/07/18  Yes [provider]  traZODone (DESYREL) 100 MG tablet Take 100 mg by mouth at bedtime.  12/11/17  Yes [provider]  QUEtiapine (SEROQUEL) 25 MG tablet Take 25 mg by mouth every evening. 07/20/18 09/26/18  [provider]    Allergies Amoxicillin, Shellfish allergy,  Chocolate flavor, Sulfa antibiotics, and Latex  History reviewed. No pertinent family history.  Social History Social History   Tobacco Use   Smoking status: Never   Smokeless tobacco: Never  Vaping Use   Vaping Use: Never used  Substance Use Topics   Alcohol use: Never   Drug use: Never    Review of Systems Constitutional: No fever/chills Eyes: No visual changes. ENT: No sore throat.  Daughter reports she had a bad dental infection that has improved where  she had to have several teeth extracted in the past Cardiovascular: Denies chest pain. Respiratory: Denies shortness of breath. Gastrointestinal: No abdominal pain.   Genitourinary: No known dysuria Musculoskeletal: Patient denies any pain or discomfort anywhere.  Does not feel weak in anyone particular arm or leg Skin: Negative for rash. Neurological: Negative for headaches, areas of focal weakness or numbness.  Reviewed medication list from patient's care facility, does appear she is compliant with her medications and she was given her morning medicine at 8 AM.  Has had no fevers for the last several days on her daily fever check  ____________________________________________   PHYSICAL EXAM:  VITAL SIGNS: ED Triage Vitals  Enc Vitals Group     BP 01/01/21 0942 134/79     Pulse Rate 01/01/21 0942 86     Resp 01/01/21 0942 18     Temp 01/01/21 0942 98.7 F (37.1 C)     Temp Source 01/01/21 0942 Oral     SpO2 01/01/21 0942 98 %     Weight 01/01/21 0939 115 lb 1.3 oz (52.2 kg)     Height 01/01/21 0939 5\' 5"  (1.651 m)     Head Circumference --      Peak Flow --      Pain Score 01/01/21 0943 0     Pain Loc --      Pain Edu? --      Excl. in GC? --     Constitutional: Alert and oriented to self and daughter as well as reports her age to be 89 which is approximately her true age. Well appearing and in no acute distress. Eyes: Conjunctivae are normal. Head: Atraumatic. Nose: No congestion/rhinnorhea. Mouth/Throat: Mucous membranes are moist.  No noted edema of the jaw, mucosa, or teeth.  There is notable plaque buildup, but I do not see evidence of acute dental infection Neck: No stridor.  Cardiovascular: Normal rate, irregular rhythm. Grossly normal heart sounds.  Good peripheral circulation. Respiratory: Normal respiratory effort.  No retractions. Lungs CTAB. Gastrointestinal: Soft and nontender. No distention. Musculoskeletal: No lower extremity tenderness nor  edema. Neurologic:  Normal speech and language. No gross focal neurologic deficits are appreciated.  No facial droop.  Normal extraocular movements.  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  She moves all extremities to command.  She has 4-1/2 out of 5 strength in the lower extremities bilateral but there is no unilateral deficits.  Demonstrates 5-5 strength in upper extremities bilateral. Skin:  Skin is warm, dry and intact. No rash noted. Psychiatric: Mood and affect are normal. Speech and behavior are normal.  ____________________________________________   LABS (all labs ordered are listed, but only abnormal results are displayed)  Labs Reviewed  COMPREHENSIVE METABOLIC PANEL - Abnormal; Notable for the following components:      Result Value   Glucose, Bld 178 (*)    BUN 40 (*)    Total Bilirubin 1.4 (*)    All other components within  normal limits  URINALYSIS, COMPLETE (UACMP) WITH MICROSCOPIC - Abnormal; Notable for the following components:   Color, Urine YELLOW (*)    APPearance CLEAR (*)    All other components within normal limits  RESP PANEL BY RT-PCR (FLU A&B, COVID) ARPGX2  CBC WITH DIFFERENTIAL/PLATELET  TSH  AMMONIA  VITAMIN B12  HEMOGLOBIN A1C  LIPID PANEL   ____________________________________________  EKG  Reviewed inter by me at 940 Heart rate 99 QRS 89 QTc 440 Atrial fibrillation, no evidence of acute ischemia or ectopy otherwise ____________________________________________  RADIOLOGY  CT Head Wo Contrast  Result Date: 01/01/2021 CLINICAL DATA:  Delirium.  Confusion. EXAM: CT HEAD WITHOUT CONTRAST TECHNIQUE: Contiguous axial images were obtained from the base of the skull through the vertex without intravenous contrast. COMPARISON:  September 06, 2019. 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 No mass effect or midline shift is noted. Ventricular size is within normal limits. There  is no evidence of mass lesion, hemorrhage or acute infarction. Vascular: No hyperdense vessel or unexpected calcification. Skull: Normal. Negative for fracture or focal lesion. Sinuses/Orbits: No acute finding. Other: None. IMPRESSION: No acute intracranial abnormality seen. Electronically Signed   By: Lupita Raider M.D.   On: 01/01/2021 10:51   DG Chest Port 1 View  Result Date: 01/01/2021 CLINICAL DATA:  87 year old female with history of altered mental status and weakness. Atrial fibrillation. EXAM: PORTABLE CHEST 1 VIEW COMPARISON:  Chest x-ray 09/26/2018. FINDINGS: Lung volumes are low. No consolidative airspace disease. No pleural effusions. No pneumothorax. No pulmonary nodule or mass noted. Pulmonary vasculature is normal. Heart size is mildly enlarged. The patient is rotated to the left on today's exam, resulting in distortion of the mediastinal contours and reduced diagnostic sensitivity and specificity for mediastinal pathology. Atherosclerotic calcifications in the thoracic aorta. IMPRESSION: 1. Low lung volumes without radiographic evidence of acute cardiopulmonary disease. 2. Mild cardiomegaly. 3. Aortic atherosclerosis. Electronically Signed   By: Trudie Reed M.D.   On: 01/01/2021 10:37     CT head reviewed negative for acute.  Chest x-ray reviewed negative for acute finding. ____________________________________________   PROCEDURES  Procedure(s) performed: None  Procedures  Critical Care performed: No  ____________________________________________   INITIAL IMPRESSION / ASSESSMENT AND PLAN / ED COURSE  Pertinent labs & imaging results that were available during my care of the patient were reviewed by me and considered in my medical decision making (see chart for details).   Patient presents for fatigue and weakness, also some slight confusion.  Unable to ambulate to her baseline today.  Overall clinical exam fairly reassuring, daughter does report she seems just  slightly confused from her baseline.  She is in no acute distress at this time hemodynamics reassuring.  However, with attempt ambulate after work-up here she is unable to get herself up unable to ambulate safely or independently and cannot get out of bed to use a walker.  Etiology is still somewhat unclear and I suspect bears need for further work-up.  Provide hydration, discussed case with the hospitalist will admit the patient for further care and work-up at this point as this is represents a pretty significant change in her baseline occurring over the last roughly 24 hours.  My pretest probability for stroke is quite low, he denies any acute cardiac pulmonary or vascular symptoms at this time.  Admitted to hospitalist Dr. Chipper Herb.  Labs reviewed notable for slightly increased BUN.  Normal CBC.  Urinalysis without evidence of infection.  ____________________________________________   FINAL CLINICAL IMPRESSION(S) / ED DIAGNOSES  Final diagnoses:  Weakness  Altered mental status, unspecified altered mental status type        Note:  This document was prepared using Dragon voice recognition software and may include unintentional dictation errors       Sharyn Creamer, MD 01/01/21 1431

## 2021-01-01 NOTE — H&P (Signed)
History and Physical    Gabrielle Navarro RKY:[id number] DOB: [birth year] DOA: 01/01/2021  PCP: Carren Rang, PA-C (Confirm with patient/family/NH records and if not entered, this has to be entered at [facility] point of entry) Patient coming from: Assisted living  I have personally briefly reviewed patient's old medical records in [facility]  Chief Complaint: Feeling weak  HPI: Gabrielle Navarro is a 87 y.o. female with medical history significant of chronic A. fib on Eliquis, HTN, dementia/memory loss, GERD, chronic ambulation dysfunction baseline roller walker, presented with weakness and confusion.  Patient has hard hearing, most history provided by patient daughter at bedside.  Lives by herself at a memory care unit at [facility] assisted living facility, at baseline, she can perform most of daily activity and dresses herself and walks with rolling walker.  Daughter visited patient yesterday evening and found the patient was weaker than her baseline, she attributed that to the patient not being very active and being seated for too long.  This morning, facility team found the patient could not get out of bed, when she tried to stand her patient unable to stand.  Daughter arrived and found patient very confused.  Patient denies any pain, no fever chills no urinary problems or diarrhea.  ED Course: Afebrile, heart rate in A. fib, no tachycardia, CT head negative for, CBC BMP, UA negative for acute findings.  Review of Systems: Unable to perform, patient has hard hearing  Past Medical History:  Diagnosis Date   Atrial fibrillation (HCC)    Dementia (HCC)    GERD (gastroesophageal reflux disease)     History reviewed. No pertinent surgical history.   reports that she has never smoked. She has never used smokeless tobacco. She reports that she does not drink alcohol and does not use drugs.  Allergies  Allergen Reactions   Amoxicillin Anaphylaxis    Patient has tolerated cefdinir within the last  year.  See full allergy assessment progress note from 10/9   Shellfish Allergy Anaphylaxis   Chocolate Flavor Hives   Sulfa Antibiotics Other (See Comments)   Latex Rash    History reviewed. No pertinent family history.   Prior to Admission medications   Medication Sig Start Date End Date Taking? Authorizing Provider  apixaban (ELIQUIS) 2.5 MG TABS tablet Take 2.5 mg by mouth 2 (two) times daily.  05/04/18  Yes [provider]  carvedilol (COREG) 6.25 MG tablet Take 6.25 mg by mouth 2 (two) times daily with a meal.  01/01/18  Yes [provider]  lactose free nutrition (BOOST) LIQD Take 237 mLs by mouth 3 (three) times daily between meals.   Yes [provider]  levothyroxine (SYNTHROID, LEVOTHROID) 25 MCG tablet Take 25 mcg by mouth daily before breakfast.  02/09/16  Yes [provider]  losartan (COZAAR) 50 MG tablet Take 50 mg by mouth 2 (two) times a day. 07/02/18  Yes [provider]  memantine (NAMENDA XR) 28 MG CP24 24 hr capsule Take 28 mg by mouth daily.   Yes [provider]  mirtazapine (REMERON) 7.5 MG tablet Take 7.5 mg by mouth at bedtime.   Yes [provider]  omeprazole (PRILOSEC) 10 MG capsule Take 10 mg by mouth daily.  04/07/18  Yes [provider]  traZODone (DESYREL) 100 MG tablet Take 100 mg by mouth at bedtime.  12/11/17  Yes [provider]  QUEtiapine (SEROQUEL) 25 MG tablet Take 25 mg by mouth every evening. 07/20/18 09/26/18  [provider]  Physical Exam: Vitals:   01/01/21 0939 01/01/21 0942 01/01/21 1113 01/01/21 1255  BP:  134/79 135/81 138/81  Pulse:  86 85 87  Resp:  18 17 17   Temp:  98.7 F (37.1 C) 98.8 F (37.1 C) 98.8 F (37.1 C)  TempSrc:  Oral Oral Oral  SpO2:  98% 96% 98%  Weight: 52.2 kg     Height: 5\' 5"  (1.651 m)       Constitutional: NAD, calm, comfortable Vitals:   01/01/21 0939 01/01/21 0942 01/01/21 1113 01/01/21 1255  BP:  134/79 135/81  138/81  Pulse:  86 85 87  Resp:  18 17 17   Temp:  98.7 F (37.1 C) 98.8 F (37.1 C) 98.8 F (37.1 C)  TempSrc:  Oral Oral Oral  SpO2:  98% 96% 98%  Weight: 52.2 kg     Height: 5\' 5"  (1.651 m)      Eyes: PERRL, lids and conjunctivae normal ENMT: Mucous membranes are moist. Posterior pharynx clear of any exudate or lesions.Normal dentition.  Neck: normal, supple, no masses, no thyromegaly Respiratory: clear to auscultation bilaterally, no wheezing, no crackles. Normal respiratory effort. No accessory muscle use.  Cardiovascular: Regular rate and rhythm, no murmurs / rubs / gallops. No extremity edema. 2+ pedal pulses. No carotid bruits.  Abdomen: no tenderness, no masses palpated. No hepatosplenomegaly. Bowel sounds positive.  Musculoskeletal: no clubbing / cyanosis. No joint deformity upper and lower extremities. Good ROM, no contractures. Normal muscle tone.  Skin: no rashes, lesions, ulcers. No induration Neurologic: CN 2-12 grossly intact. Sensation intact, DTR normal. Strength 4/5 on flexion of right ankle compared to 5/5 on left side. psychiatric: Normal judgment and insight. Alert and oriented to person and place, confused about time. Normal mood.     Labs on Admission: I have personally reviewed following labs and imaging studies  CBC: Recent Labs  Lab 01/01/21 0951  WBC 7.6  NEUTROABS 6.1  HGB 14.3  HCT 41.6  MCV 93.9  PLT 177   Basic Metabolic Panel: Recent Labs  Lab 01/01/21 0951  NA 136  K 3.9  CL 101  CO2 26  GLUCOSE 178*  BUN 40*  CREATININE 0.87  CALCIUM 9.6   GFR: Estimated Creatinine Clearance: 34.7 mL/min (by C-G formula based on SCr of 0.87 mg/dL). Liver Function Tests: Recent Labs  Lab 01/01/21 0951  AST 26  ALT 14  ALKPHOS 66  BILITOT 1.4*  PROT 7.0  ALBUMIN 4.2   No results for input(s): LIPASE, AMYLASE in the last 168 hours. No results for input(s): AMMONIA in the last 168 hours. Coagulation Profile: No results for input(s): INR,  PROTIME in the last 168 hours. Cardiac Enzymes: No results for input(s): CKTOTAL, CKMB, CKMBINDEX, TROPONINI in the last 168 hours. BNP (last 3 results) No results for input(s): PROBNP in the last 8760 hours. HbA1C: No results for input(s): HGBA1C in the last 72 hours. CBG: No results for input(s): GLUCAP in the last 168 hours. Lipid Profile: No results for input(s): CHOL, HDL, LDLCALC, TRIG, CHOLHDL, LDLDIRECT in the last 72 hours. Thyroid Function Tests: Recent Labs    01/01/21 0951  TSH 2.246   Anemia Panel: No results for input(s): VITAMINB12, FOLATE, FERRITIN, TIBC, IRON, RETICCTPCT in the last 72 hours. Urine analysis:    Component Value Date/Time   COLORURINE YELLOW (A) 01/01/2021 0951   APPEARANCEUR CLEAR (A) 01/01/2021 0951   LABSPEC 1.023 01/01/2021 0951   PHURINE 6.0 01/01/2021 0951   GLUCOSEU NEGATIVE 01/01/2021 01/03/2021  HGBUR NEGATIVE 01/01/2021 0951   BILIRUBINUR NEGATIVE 01/01/2021 0951   KETONESUR NEGATIVE 01/01/2021 0951   PROTEINUR NEGATIVE 01/01/2021 0951   NITRITE NEGATIVE 01/01/2021 0951   LEUKOCYTESUR NEGATIVE 01/01/2021 0951    Radiological Exams on Admission: CT Head Wo Contrast  Result Date: 01/01/2021 CLINICAL DATA:  Delirium.  Confusion. EXAM: CT HEAD WITHOUT CONTRAST TECHNIQUE: Contiguous axial images were obtained from the base of the skull through the vertex without intravenous contrast. COMPARISON:  September 06, 2019. 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 No mass effect or midline shift is noted. Ventricular size is within normal limits. There is no evidence of mass lesion, hemorrhage or acute infarction. Vascular: No hyperdense vessel or unexpected calcification. Skull: Normal. Negative for fracture or focal lesion. Sinuses/Orbits: No acute finding. Other: None. IMPRESSION: No acute intracranial abnormality seen. Electronically Signed   By: Lupita Raider M.D.   On: 01/01/2021 10:51   DG Chest Port  1 View  Result Date: 01/01/2021 CLINICAL DATA:  87 year old female with history of altered mental status and weakness. Atrial fibrillation. EXAM: PORTABLE CHEST 1 VIEW COMPARISON:  Chest x-ray 09/26/2018. FINDINGS: Lung volumes are low. No consolidative airspace disease. No pleural effusions. No pneumothorax. No pulmonary nodule or mass noted. Pulmonary vasculature is normal. Heart size is mildly enlarged. The patient is rotated to the left on today's exam, resulting in distortion of the mediastinal contours and reduced diagnostic sensitivity and specificity for mediastinal pathology. Atherosclerotic calcifications in the thoracic aorta. IMPRESSION: 1. Low lung volumes without radiographic evidence of acute cardiopulmonary disease. 2. Mild cardiomegaly. 3. Aortic atherosclerosis. Electronically Signed   By: Trudie Reed M.D.   On: 01/01/2021 10:37    EKG: Independently reviewed.  Chronic A. fib, no acute ST-T changes  Assessment/Plan Active Problems:   Weakness   TIA (transient ischemic attack)  (please populate well all problems here in Problem List. (For example, if patient is on BP meds at home and you resume or decide to hold them, it is a problem that needs to be her. Same for CAD, COPD, HLD and so on)  TIA with acute worsening of ambulation dysfunction  -Neuro exam showed mild weakness of right ankle flexion compared to the left side, no Hx of lumbar radiculopathy. MRI ordered to rule out stroke.  Patient has her age bracket with baseline dementia can certainly have increased risk of amyloidosis related stroke. Continue Eliquis. -Permissive hypertension for 2 days.  Hold ARB and Coreg, start as needed metoprolol. -A1c and lipid panel, continue statin. -PT evaluation  Chronic A. Fib -Rate controlled, as needed metoprolol for rate control, continue Eliquis.  Elevated glucose -No history of diabetes, check A1c.  HTN -Allow permissive hypertension for 2  days  Hypothyroidism -Continue Synthroid.  TSH within normal limits.  Anxiety/depression -SSRI.  DVT prophylaxis: Eliquis Code Status: Full code Family Communication: Daughter at bedside Disposition Plan: Expect less than 2 midnight hospital stay Consults called: Secure chat with Dr. Welton Flakes (neuro) Admission status: Tele obs   Emeline General MD [facility] Pager [phone]  01/01/2021, 2:11 PM

## 2021-01-01 NOTE — Progress Notes (Signed)
PT Cancellation Note  Patient Details Name: Gabrielle Navarro MRN: [id number] DOB: [birth year]   Cancelled Treatment:    Reason Eval/Treat Not Completed: Patient at procedure or test/unavailable (Heading to MRI upon arrival.)   [name] [name] 01/01/2021, 3:11 PM

## 2021-01-02 ENCOUNTER — Observation Stay: Payer: Medicare Other

## 2021-01-02 ENCOUNTER — Observation Stay (HOSPITAL_COMMUNITY)
Admit: 2021-01-02 | Discharge: 2021-01-02 | Disposition: A | Discharge status: Home / Self Care | Payer: Medicare Other | Attending: Internal Medicine | Admitting: Internal Medicine

## 2021-01-02 DIAGNOSIS — Z7901 Long term (current) use of anticoagulants: Secondary | ICD-10-CM | POA: Diagnosis not present

## 2021-01-02 DIAGNOSIS — M25561 Pain in right knee: Secondary | ICD-10-CM

## 2021-01-02 DIAGNOSIS — I10 Essential (primary) hypertension: Secondary | ICD-10-CM | POA: Diagnosis present

## 2021-01-02 DIAGNOSIS — E860 Dehydration: Secondary | ICD-10-CM

## 2021-01-02 DIAGNOSIS — G459 Transient cerebral ischemic attack, unspecified: Secondary | ICD-10-CM | POA: Diagnosis present

## 2021-01-02 DIAGNOSIS — I482 Chronic atrial fibrillation, unspecified: Secondary | ICD-10-CM | POA: Diagnosis present

## 2021-01-02 DIAGNOSIS — M25569 Pain in unspecified knee: Secondary | ICD-10-CM | POA: Diagnosis present

## 2021-01-02 DIAGNOSIS — R531 Weakness: Secondary | ICD-10-CM | POA: Diagnosis not present

## 2021-01-02 DIAGNOSIS — M79674 Pain in right toe(s): Secondary | ICD-10-CM

## 2021-01-02 DIAGNOSIS — M11261 Other chondrocalcinosis, right knee: Secondary | ICD-10-CM | POA: Diagnosis present

## 2021-01-02 DIAGNOSIS — F0393 Unspecified dementia, unspecified severity, with mood disturbance: Secondary | ICD-10-CM | POA: Diagnosis present

## 2021-01-02 DIAGNOSIS — R739 Hyperglycemia, unspecified: Secondary | ICD-10-CM | POA: Diagnosis present

## 2021-01-02 DIAGNOSIS — Z79890 Hormone replacement therapy (postmenopausal): Secondary | ICD-10-CM | POA: Diagnosis not present

## 2021-01-02 DIAGNOSIS — R7301 Impaired fasting glucose: Secondary | ICD-10-CM | POA: Diagnosis present

## 2021-01-02 DIAGNOSIS — Z91018 Allergy to other foods: Secondary | ICD-10-CM | POA: Diagnosis not present

## 2021-01-02 DIAGNOSIS — Z79899 Other long term (current) drug therapy: Secondary | ICD-10-CM | POA: Diagnosis not present

## 2021-01-02 DIAGNOSIS — F32A Depression, unspecified: Secondary | ICD-10-CM

## 2021-01-02 DIAGNOSIS — Z882 Allergy status to sulfonamides status: Secondary | ICD-10-CM | POA: Diagnosis not present

## 2021-01-02 DIAGNOSIS — M1120 Other chondrocalcinosis, unspecified site: Secondary | ICD-10-CM | POA: Diagnosis not present

## 2021-01-02 DIAGNOSIS — F419 Anxiety disorder, unspecified: Secondary | ICD-10-CM

## 2021-01-02 DIAGNOSIS — Z880 Allergy status to penicillin: Secondary | ICD-10-CM | POA: Diagnosis not present

## 2021-01-02 DIAGNOSIS — Z91040 Latex allergy status: Secondary | ICD-10-CM | POA: Diagnosis not present

## 2021-01-02 DIAGNOSIS — K219 Gastro-esophageal reflux disease without esophagitis: Secondary | ICD-10-CM | POA: Diagnosis present

## 2021-01-02 DIAGNOSIS — F0394 Unspecified dementia, unspecified severity, with anxiety: Secondary | ICD-10-CM | POA: Diagnosis present

## 2021-01-02 DIAGNOSIS — E039 Hypothyroidism, unspecified: Secondary | ICD-10-CM | POA: Diagnosis present

## 2021-01-02 DIAGNOSIS — Z91013 Allergy to seafood: Secondary | ICD-10-CM | POA: Diagnosis not present

## 2021-01-02 DIAGNOSIS — Z20822 Contact with and (suspected) exposure to covid-19: Secondary | ICD-10-CM | POA: Diagnosis present

## 2021-01-02 DIAGNOSIS — N179 Acute kidney failure, unspecified: Secondary | ICD-10-CM | POA: Diagnosis present

## 2021-01-02 LAB — URIC ACID: Uric Acid, Serum: 2.1 mg/dL — ABNORMAL LOW (ref 2.5–7.1)

## 2021-01-02 LAB — SYNOVIAL CELL COUNT + DIFF, W/ CRYSTALS
Eosinophils-Synovial: 0 %
Lymphocytes-Synovial Fld: 1 %
Monocyte-Macrophage-Synovial Fluid: 14 %
Neutrophil, Synovial: 85 %
Other Cells-SYN: 0
WBC, Synovial: 4838 /mm3 — ABNORMAL HIGH (ref 0–200)

## 2021-01-02 LAB — HEMOGLOBIN A1C
Hgb A1c MFr Bld: 6.3 % — ABNORMAL HIGH (ref 4.8–5.6)
Mean Plasma Glucose: 134 mg/dL

## 2021-01-02 MED ORDER — COLCHICINE 0.6 MG PO TABS
0.6000 mg | ORAL_TABLET | Freq: Once | ORAL | Status: AC
  Administered 2021-01-02: 0.6 mg via ORAL
  Filled 2021-01-02: qty 1

## 2021-01-02 MED ORDER — NAPROXEN 250 MG PO TABS
250.0000 mg | ORAL_TABLET | Freq: Once | ORAL | Status: AC
  Administered 2021-01-02: 11:00:00 250 mg via ORAL
  Filled 2021-01-02: qty 1

## 2021-01-02 MED ORDER — SODIUM CHLORIDE 0.9 % IV BOLUS
500.0000 mL | Freq: Once | INTRAVENOUS | Status: AC
  Administered 2021-01-02: 500 mL via INTRAVENOUS

## 2021-01-02 MED ORDER — TRIAMCINOLONE ACETONIDE 40 MG/ML IJ SUSP
80.0000 mg | Freq: Once | INTRAMUSCULAR | Status: AC
  Administered 2021-01-02: 80 mg via INTRA_ARTICULAR
  Filled 2021-01-02: qty 2

## 2021-01-02 MED ORDER — BUPIVACAINE HCL (PF) 0.5 % IJ SOLN
10.0000 mL | Freq: Once | INTRAMUSCULAR | Status: AC
  Administered 2021-01-02: 18:00:00 10 mL via INTRA_ARTICULAR
  Filled 2021-01-02: qty 10

## 2021-01-02 MED ORDER — INDOMETHACIN 25 MG PO CAPS
25.0000 mg | ORAL_CAPSULE | Freq: Two times a day (BID) | ORAL | Status: DC
  Filled 2021-01-02: qty 1

## 2021-01-02 MED ORDER — CARVEDILOL 6.25 MG PO TABS
6.2500 mg | ORAL_TABLET | Freq: Two times a day (BID) | ORAL | Status: DC
  Administered 2021-01-02 – 2021-01-05 (×7): 6.25 mg via ORAL
  Filled 2021-01-02 (×7): qty 1

## 2021-01-02 NOTE — Progress Notes (Signed)
Patient ID: Gabrielle Navarro, female   DOB: 07-23-1927, 87 y.o.   MRN: [id number] [facility] PROGRESS NOTE  Gabrielle Navarro [id number]:[id number] DOB: 1927/10/25 DOA: 01/01/2021 PCP: Carren Rang, PA-C  HPI/Subjective: As per patient's daughter the patient is at [facility] assisted living and they sent her over because she was unable to walk.  She needs to be able to walk in order to go back to [facility] assisted living.  Physical therapy saw the patient this morning and are recommending rehab versus 24/7 supervision.  The patient does complain of some pain in her right knee and right first toe.  Objective: Vitals:   01/02/21 0538 01/02/21 0751  BP: 129/68 139/74  Pulse: 90 92  Resp: 18   Temp: 98.6 F (37 C) 99.4 F (37.4 C)  SpO2: 94% 93%    Intake/Output Summary (Last 24 hours) at 01/02/2021 1118 Last data filed at 01/02/2021 1029 Gross per 24 hour  Intake 980 ml  Output 500 ml  Net 480 ml   Filed Weights   01/01/21 0939  Weight: 52.2 kg    ROS: Review of Systems  Respiratory:  Negative for cough and shortness of breath.   Cardiovascular:  Negative for chest pain.  Gastrointestinal:  Negative for abdominal pain.  Musculoskeletal:  Positive for joint pain.  Exam: Physical Exam HENT:     Head: Normocephalic.     Mouth/Throat:     Pharynx: No oropharyngeal exudate.  Eyes:     General: Lids are normal.     Conjunctiva/sclera: Conjunctivae normal.  Cardiovascular:     Rate and Rhythm: Normal rate and regular rhythm.     Heart sounds: Normal heart sounds, S1 normal and S2 normal.  Pulmonary:     Breath sounds: No decreased breath sounds, wheezing, rhonchi or rales.  Abdominal:     Palpations: Abdomen is soft.     Tenderness: There is no abdominal tenderness.  Musculoskeletal:     Right knee: Swelling present. Tenderness present.     Right lower leg: No swelling.     Left lower leg: No swelling.     Comments: Pain to palpating right first toe.  Skin:    General: Skin  is warm.     Findings: No rash.  Neurological:     Mental Status: She is alert.     Comments: Answer some simple questions.  Able to follow some simple commands.  Patient has trouble straight leg raising her right leg secondary to pain in the knee.      Scheduled Meds:   stroke: mapping our early stages of recovery book   Does not apply Once   apixaban  2.5 mg Oral BID   carvedilol  6.25 mg Oral BID WC   lactose free nutrition  237 mL Oral TID BM   levothyroxine  25 mcg Oral Q0600   memantine  28 mg Oral Daily   mirtazapine  7.5 mg Oral QHS   pantoprazole  40 mg Oral Daily   traZODone  100 mg Oral QHS    Assessment/Plan:  Right knee pain and right first toe pain.  We will get an x-ray of the right knee.  Suspected gout/pseudogout.  We will give a dose of Naprosyn and colchicine to see if helps out with pain.  I added on a uric acid which was actually low.  We will get orthopedic consultation. Dehydration we will give a fluid bolus.  Continue eating. Weakness and inability to walk.  Physical therapy recommending rehab versus  24-hour supervision.  [facility] assisted living will not take her back and less able to walk.  Likely will end up needing rehab.  MRI of the brain negative for acute events. Chronic atrial fibrillation on Coreg for rate control and Eliquis for anticoagulation Impaired fasting glucose hemoglobin A1c 6.3 Essential hypertension restart Coreg Hypothyroidism unspecified on Synthroid Dementia on Namenda Anxiety depression on trazodone and Remeron at night     Code Status:     Code Status Orders  (From admission, onward)           Start     Ordered   01/01/21 1410  Full code  Continuous        01/01/21 1410           Code Status History     Date Active Date Inactive Code Status Order ID Comments User Context   10/03/2020 1703 10/05/2020 1921 Full Code [id number]  Lovenia Kim, DO ED   09/07/2019 0111 09/08/2019 0154 Full Code [id number]  Rometta Emery, MD ED      Advance Directive Documentation    Flowsheet Row Most Recent Value  Type of Advance Directive Healthcare Power of Attorney  Pre-existing out of facility DNR order (yellow form or pink MOST form) --  "MOST" Form in Place? --      Family Communication: Spoke with patient's daughter at the bedside Disposition Plan: Status is: Observation  Consultants: Orthopedics  Time spent: 27 minutes  Gabrielle Navarro [employer]

## 2021-01-02 NOTE — Evaluation (Signed)
Clinical/Bedside Swallow Evaluation Patient Details  Name: Gabrielle Navarro MRN: [id number] Date of Birth: [birth year]  Today's Date: 01/02/2021 Time: SLP Start Time (ACUTE ONLY): 1335 SLP Stop Time (ACUTE ONLY): 1409 SLP Time Calculation (min) (ACUTE ONLY): 34 min  Past Medical History:  Past Medical History:  Diagnosis Date   Atrial fibrillation (HCC)    Dementia (HCC)    GERD (gastroesophageal reflux disease)    Past Surgical History: History reviewed. No pertinent surgical history. HPI:  Per admitting H&P "Gabrielle Navarro is a 87 y.o. female with medical history significant of chronic A. fib on Eliquis, HTN, dementia/memory loss, GERD, chronic ambulation dysfunction baseline roller walker, presented with weakness and confusion.     Patient has hard hearing, most history provided by patient daughter at bedside.  Lives by herself at a memory care unit at [facility] assisted living facility, at baseline, she can perform most of daily activity and dresses herself and walks with rolling walker.  Daughter visited patient yesterday evening and found the patient was weaker than her baseline, she attributed that to the patient not being very active and being seated for too long.  This morning, facility team found the patient could not get out of bed, when she tried to stand her patient unable to stand.  Daughter arrived and found patient very confused.  Patient denies any pain, no fever chills no urinary problems or diarrhea."    Assessment / Plan / Recommendation  Clinical Impression  Pt. presents with mild oral dysphagia but no overt s/s of aspiration with any tested consistency. Daughter reported some concerns with swallowing soups and pills earlier. Oral mech exam revealed structures to be functioning adequately. Noted some right facial droop. Pt took very small bites of solids and needed extended time to chew but was able to clear oral cavity adequately. Vocal quality remained clear and laryngeal elevation  appeared adequate. Pt was very pleasant and told ST pleasant stories about her childhood (repetitive) . Speech was 100% intelligible. Will rec diet downgrade to mechanical soft (Dysphagia 3) for ease of chewing. If Pt is discharged back to asisted living, regular diet is fine to order. Daughter reports Pt has had some recent denatal work. ST to follow up with Nsg on toleration of diet and alter if needed. Prognosis good. SLP Visit Diagnosis: Dysphagia, unspecified (R13.10)    Aspiration Risk  Mild aspiration risk    Diet Recommendation Dysphagia 3 (Mech soft);Thin liquid   Liquid Administration via: Straw Medication Administration: Whole meds with puree Supervision: Staff to assist with self feeding Compensations: Minimize environmental distractions;Small sips/bites;Follow solids with liquid Postural Changes: Seated upright at 90 degrees;Remain upright for at least 30 minutes after po intake    Other  Recommendations Oral Care Recommendations: Oral care BID    Recommendations for follow up therapy are one component of a multi-disciplinary discharge planning process, led by the attending physician.  Recommendations may be updated based on patient status, additional functional criteria and insurance authorization.  Follow up Recommendations Skilled Nursing facility      Frequency and Duration     N/A Follow up with toleration of diet       Prognosis    Good    Swallow Study   General Date of Onset: 01/01/21 HPI: Per admitting H&P "Gabrielle Navarro is a 87 y.o. female with medical history significant of chronic A. fib on Eliquis, HTN, dementia/memory loss, GERD, chronic ambulation dysfunction baseline roller walker, presented with weakness and confusion.     Patient  has hard hearing, most history provided by patient daughter at bedside.  Lives by herself at a memory care unit at [facility] assisted living facility, at baseline, she can perform most of daily activity and dresses herself and  walks with rolling walker.  Daughter visited patient yesterday evening and found the patient was weaker than her baseline, she attributed that to the patient not being very active and being seated for too long.  This morning, facility team found the patient could not get out of bed, when she tried to stand her patient unable to stand.  Daughter arrived and found patient very confused.  Patient denies any pain, no fever chills no urinary problems or diarrhea." Type of Study: Bedside Swallow Evaluation Diet Prior to this Study: Regular Respiratory Status: Room air History of Recent Intubation: No Behavior/Cognition: Alert;Cooperative;Pleasant mood;Confused;Other (Comment) (repeated a story 3 times during 30 minute assessment. Very pleasant!) Oral Cavity Assessment: Within Functional Limits Oral Cavity - Dentition: Adequate natural dentition;Missing dentition Self-Feeding Abilities: Needs assist Patient Positioning: Upright in bed Baseline Vocal Quality: Normal    Oral/Motor/Sensory Function Overall Oral Motor/Sensory Function: Within functional limits   Ice Chips Ice chips: Within functional limits   Thin Liquid Thin Liquid: Within functional limits Presentation: Straw    Nectar Thick Nectar Thick Liquid: Not tested   Honey Thick Honey Thick Liquid: Not tested   Puree Puree: Within functional limits   Solid     Solid: Impaired (Needed extra time to chew, small bites) Presentation: Self Fed Oral Phase Functional Implications: Prolonged oral transit      Eather Colas 01/02/2021,2:10 PM

## 2021-01-02 NOTE — Progress Notes (Signed)
Message sent to case management, at request of patient's daughter. To facilitate placement for her mother rehab vs. Back to [facility].

## 2021-01-02 NOTE — Evaluation (Signed)
Physical Therapy Evaluation Patient Details Name: Gabrielle Navarro MRN: [id number] DOB: [birth year] Today's Date: 01/02/2021  History of Present Illness  Pt is a 87 y.o. female with medical history significant of chronic A. fib on Eliquis, HTN, dementia/memory loss, GERD, chronic ambulation dysfunction baseline roller walker, presented with weakness and confusion. MD assessment includes TIA, weakness, elevated glucose, and hypothyroidism.   Clinical Impression  Pt was pleasant and motivated to participate during the session and put forth good effort throughout. Pt required physical assistance with bed mobility and transfers and once in standing was very antalgic on the RLE and was unable to come to full upright standing or to advance either LE.  Pt presents with a significant decline in function compared to her baseline where she is a Mod Ind ambulator facility distances in her ALF.  Pt will benefit from PT services in a SNF setting upon discharge to safely address deficits listed in patient problem list for decreased caregiver assistance and eventual return to PLOF.          Recommendations for follow up therapy are one component of a multi-disciplinary discharge planning process, led by the attending physician.  Recommendations may be updated based on patient status, additional functional criteria and insurance authorization.  Follow Up Recommendations SNF;Supervision/Assistance - 24 hour    Equipment Recommendations  None recommended by PT    Recommendations for Other Services       Precautions / Restrictions Precautions Precautions: Fall Restrictions Weight Bearing Restrictions: No      Mobility  Bed Mobility Overal bed mobility: Needs Assistance Bed Mobility: Supine to Sit;Sit to Supine     Supine to sit: Mod assist;HOB elevated Sit to supine: Mod assist   General bed mobility comments: Mod A for BLE and trunk control    Transfers Overall transfer level: Needs  assistance Equipment used: Rolling walker (2 wheeled) Transfers: Sit to/from Stand;Lateral/Scoot Transfers Sit to Stand: Min assist;From elevated surface         General transfer comment: Pt unable to come to full upright position, guarded secondary to R knee and foot pain with WB; pt able to laterally scoot around one foot with multiple attempts and cuing for sequencing  Ambulation/Gait             General Gait Details: Unable to advance either LE in standing  Stairs            Wheelchair Mobility    Modified Rankin (Stroke Patients Only)       Balance Overall balance assessment: Needs assistance   Sitting balance-Leahy Scale: Good     Standing balance support: Bilateral upper extremity supported Standing balance-Leahy Scale: Poor Standing balance comment: Max lean on the RW for support along with physical assist to maintain standing position, limited by R knee pain                             Pertinent Vitals/Pain Pain Assessment: Faces Faces Pain Scale: Hurts even more Pain Location: R knee and foot with movement and weightbearing, no pain at rest Pain Descriptors / Indicators: Grimacing;Guarding Pain Intervention(s): Repositioned;Monitored during session    Home Living Family/patient expects to be discharged to:: Assisted living               Home Equipment: Walker - 4 wheels;Walker - 2 wheels      Prior Function Level of Independence: Needs assistance   Gait / Transfers Assistance Needed: Pt Mod  Ind with a ambulation with a rollator facility distances at [facility] in the memory care unit, no fall history, Ind with transfers and bed mobility  ADL's / Homemaking Assistance Needed: Pt Ind with toileting (pt is incontinent but takes herself to/from the bathroom), Ind with dressing, needs assist from staff with bathing, meals, and meds        Hand Dominance        Extremity/Trunk Assessment   Upper Extremity  Assessment Upper Extremity Assessment: Defer to OT evaluation    Lower Extremity Assessment Lower Extremity Assessment: Generalized weakness;RLE deficits/detail RLE Deficits / Details: No pain at rest but painful with AROM, PROM, and WB through the RLE RLE: Unable to fully assess due to pain       Communication   Communication: HOH  Cognition Arousal/Alertness: Awake/alert Behavior During Therapy: WFL for tasks assessed/performed Overall Cognitive Status: Impaired/Different from baseline Area of Impairment: Following commands                       Following Commands: Follows one step commands with increased time       General Comments: Per daughter patient close to cognitive baseline but with some increased difficulty following commands      General Comments      Exercises Total Joint Exercises Long Arc Quad: AROM;Strengthening;Both;10 reps (minimal amplitude on the RLE) Knee Flexion: AROM;Strengthening;Both;10 reps (minimal amplitude on the RLE)   Assessment/Plan    PT Assessment Patient needs continued PT services  PT Problem List Decreased strength;Decreased activity tolerance;Decreased balance;Decreased mobility;Decreased knowledge of use of DME;Pain       PT Treatment Interventions DME instruction;Gait training;Functional mobility training;Therapeutic activities;Therapeutic exercise;Balance training;Patient/family education    PT Goals (Current goals can be found in the Care Plan section)  Acute Rehab PT Goals Patient Stated Goal: To be able to walk again PT Goal Formulation: With patient/family Time For Goal Achievement: 01/15/21 Potential to Achieve Goals: Good    Frequency Min 2X/week   Barriers to discharge Inaccessible home environment;Decreased caregiver support      Co-evaluation               AM-PAC PT "6 Clicks" Mobility  Outcome Measure Help needed turning from your back to your side while in a flat bed without using bedrails?:  A Little Help needed moving from lying on your back to sitting on the side of a flat bed without using bedrails?: A Lot Help needed moving to and from a bed to a chair (including a wheelchair)?: A Lot Help needed standing up from a chair using your arms (e.g., wheelchair or bedside chair)?: A Lot Help needed to walk in hospital room?: Total Help needed climbing 3-5 steps with a railing? : Total 6 Click Score: 11    End of Session Equipment Utilized During Treatment: Gait belt Activity Tolerance: Patient limited by pain Patient left: in bed;with call bell/phone within reach;with bed alarm set;with nursing/sitter in room;with family/visitor present Nurse Communication: Mobility status PT Visit Diagnosis: Unsteadiness on feet (R26.81);Difficulty in walking, not elsewhere classified (R26.2);Muscle weakness (generalized) (M62.81);Pain Pain - Right/Left: Right Pain - part of body: Knee;Ankle and joints of foot    Time: 9811-9147 PT Time Calculation (min) (ACUTE ONLY): 30 min   Charges:   PT Evaluation $PT Eval Moderate Complexity: 1 Mod         D. Scott Mariaelena Cade PT, DPT 01/02/21, 11:00 AM

## 2021-01-02 NOTE — TOC Initial Note (Addendum)
Transition of Care [facility]) - Initial/Assessment Note    Patient Details  Name: Gabrielle Navarro MRN: [id number] Date of Birth: July 17, 1927  Transition [facility]) CM/SW Contact:    Verna Czech [place], [place] Phone Number: 01/02/2021, 12:57 PM  Clinical Narrative:                 Patient's daughter [phone] contacted by phone to discuss SNF recommendation. Patient's daughter confirmed that patient has been residing at [facility] for the past  year, had been pretty independent up until this point. Per patient's daughter, she has a history of HH services and a SNF stay in the past. Patient and patient's daughter agreeable to SNF placement and have a preference of [facility] in [place] or [address] [facility]. Patient's daughter would like to start with these two facilities before expanding the search. Fl2 completed and faxed out to [facility] and [facility]. [place] Must system down for maintenance, PASSR pending.  2:00pm  Patient's daughter requested that bed search be expanded to include [place]- Fl2 faxed out to [facility] of Care to continue to follow  [facility], LCSW Transition of Care [phone]    Expected Discharge Plan: Skilled Nursing Facility Barriers to Discharge: Continued Medical Work up   Patient Goals and CMS Choice   CMS Medicare.gov Compare Post Acute Care list provided to:: Patient Represenative (must comment) Choice offered to / list presented to : Adult Children  Expected Discharge Plan and Services Expected Discharge Plan: Skilled Nursing Facility In-house Referral: Clinical Social Work   Post Acute Care Choice: Skilled Nursing Facility Living arrangements for the past 2 months: Assisted Living Facility ([facility])                                      Prior Living Arrangements/Services Living arrangements for the past 2 months: Assisted Living Facility ([facility]) Lives with:: Facility Resident           Need for Family Participation in Patient Care: Yes (Comment) Care giver support system in place?: Yes (comment)   Criminal Activity/Legal Involvement Pertinent to Current Situation/Hospitalization: No - Comment as needed  Activities of Daily Living Home Assistive Devices/Equipment: Environmental consultant (specify type) ADL Screening (condition at time of admission) Patient's cognitive ability adequate to safely complete daily activities?: Yes Is the patient deaf or have difficulty hearing?: Yes Does the patient have difficulty seeing, even when wearing glasses/contacts?: Yes Does the patient have difficulty concentrating, remembering, or making decisions?: Yes Patient able to express need for assistance with ADLs?: No Does the patient have difficulty dressing or bathing?: No Independently performs ADLs?: Yes (appropriate for developmental age) Does the patient have difficulty walking or climbing stairs?: Yes Weakness of Legs: Both Weakness of Arms/Hands: None  Permission Sought/Granted Permission sought to share information with : Family Supports Permission granted to share information with : Yes, Verbal Permission Granted        Permission granted to share info w Relationship: Sherri Sear daughter  [phone]     Emotional Assessment       Orientation: : Oriented to Self, Oriented to Place, Oriented to Situation Alcohol / Substance Use: Never Used Psych Involvement: No (comment)  Admission diagnosis:  TIA (transient ischemic attack) [G45.9] Weakness [R53.1] Altered mental status, unspecified altered mental status type [R41.82] Patient Active Problem List   Diagnosis Date Noted   Acute pain of right knee  Pain of toe of right foot    Dehydration    Impaired fasting glucose    Anxiety and depression    Weakness 01/01/2021   TIA (transient ischemic attack) 01/01/2021   Facial cellulitis    Dental abscess    Cellulitis 10/03/2020   Atrial fibrillation, chronic (HCC)  09/06/2019   Dementia (HCC) 09/06/2019   HOH (hard of hearing) 09/06/2019   GERD (gastroesophageal reflux disease) 09/06/2019   Essential hypertension 09/06/2019   Hypothyroidism 09/06/2019   Syncope and collapse 09/06/2019   PCP:  Carren Rang, PA-C Pharmacy:   [id number] - [place], [place] - [address] AT [facility] [address] [place] [address] Phone: [phone] Fax: [phone]     Social Determinants of Health (SDOH) Interventions    Readmission Risk Interventions No flowsheet data found.

## 2021-01-02 NOTE — Consult Note (Signed)
ORTHOPAEDIC CONSULTATION  REQUESTING PHYSICIAN: Alford Highland, MD  Chief Complaint:   Right knee pain and swelling.  History of Present Illness: Gabrielle Navarro is a 87 y.o. female with a history of chronic atrial fibrillation, gastroesophageal reflux disease, and dementia who lives in a memory care center at [facility] assisted living facility.  The patient normally is able to perform most of her normal daily activities and dress herself.  She also ambulates with a rolling walker.  Apparently, the patient's daughter visited her several nights ago and felt that she was weaker than usual.  Yesterday morning, the patient was noted to be quite confused and was unable to stand up, even with assistance.  The patient was brought to the emergency room and subsequently admitted for further evaluation and treatment.  The patient was noted to have some swelling in her right knee, which was felt possibly to be consistent with gout, prompting orthopedic consultation.  Her uric acid level is only 2.1 and she does not have a history of gout.  However, she also complains of right big toe pain.  The patient is a poor historian so it is difficult to get any further history from her.  Past Medical History:  Diagnosis Date   Atrial fibrillation (HCC)    Dementia (HCC)    GERD (gastroesophageal reflux disease)    History reviewed. No pertinent surgical history. Social History   Socioeconomic History   Marital status: Married    Spouse name: Not on file   Number of children: Not on file   Years of education: Not on file   Highest education level: Not on file  Occupational History   Not on file  Tobacco Use   Smoking status: Never   Smokeless tobacco: Never  Vaping Use   Vaping Use: Never used  Substance and Sexual Activity   Alcohol use: Never   Drug use: Never   Sexual activity: Not Currently  Other Topics Concern   Not on file  Social  History Narrative   Not on file   Social Determinants of Health   Financial Resource Strain: Not on file  Food Insecurity: Not on file  Transportation Needs: Not on file  Physical Activity: Not on file  Stress: Not on file  Social Connections: Not on file   History reviewed. No pertinent family history. Allergies  Allergen Reactions   Amoxicillin Anaphylaxis    Patient has tolerated cefdinir within the last year.  See full allergy assessment progress note from 10/9   Shellfish Allergy Anaphylaxis   Chocolate Flavor Hives   Sulfa Antibiotics Other (See Comments)   Latex Rash   Prior to Admission medications   Medication Sig Start Date End Date Taking? Authorizing Provider  apixaban (ELIQUIS) 2.5 MG TABS tablet Take 2.5 mg by mouth 2 (two) times daily.  05/04/18  Yes [provider]  carvedilol (COREG) 6.25 MG tablet Take 6.25 mg by mouth 2 (two) times daily with a meal.  01/01/18  Yes [provider]  lactose free nutrition (BOOST) LIQD Take 237 mLs by mouth 3 (three) times daily between meals.   Yes [provider]  levothyroxine (SYNTHROID, LEVOTHROID) 25 MCG tablet Take 25 mcg by mouth daily before breakfast.  02/09/16  Yes [provider]  losartan (COZAAR) 50 MG tablet Take 50 mg by mouth 2 (two) times a day. 07/02/18  Yes [provider]  memantine (NAMENDA XR) 28 MG CP24 24 hr capsule Take 28 mg by mouth daily.  Yes [provider]  mirtazapine (REMERON) 7.5 MG tablet Take 7.5 mg by mouth at bedtime.   Yes [provider]  omeprazole (PRILOSEC) 10 MG capsule Take 10 mg by mouth daily.  04/07/18  Yes [provider]  traZODone (DESYREL) 100 MG tablet Take 100 mg by mouth at bedtime.  12/11/17  Yes [provider]  QUEtiapine (SEROQUEL) 25 MG tablet Take 25 mg by mouth every evening. 07/20/18 09/26/18  [provider]   DG Knee 1-2 Views Right  Result Date: 01/02/2021 CLINICAL DATA:  RIGHT  knee pain.  Dementia. EXAM: RIGHT KNEE - 1-2 VIEW COMPARISON:  None. FINDINGS: No evidence of fracture, dislocation, or joint effusion. No evidence of arthropathy or other focal bone abnormality. Vascular calcifications of the distal superficial femoral and popliteal arteries. Soft tissues are otherwise unremarkable. IMPRESSION: 1. No acute findings. No osseous fracture or dislocation. 2. Atherosclerosis. Electronically Signed   By: Bary Richard M.D.   On: 01/02/2021 12:00   CT Head Wo Contrast  Result Date: 01/01/2021 CLINICAL DATA:  Delirium.  Confusion. EXAM: CT HEAD WITHOUT CONTRAST TECHNIQUE: Contiguous axial images were obtained from the base of the skull through the vertex without intravenous contrast. COMPARISON:  September 06, 2019. 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 No mass effect or midline shift is noted. Ventricular size is within normal limits. There is no evidence of mass lesion, hemorrhage or acute infarction. Vascular: No hyperdense vessel or unexpected calcification. Skull: Normal. Negative for fracture or focal lesion. Sinuses/Orbits: No acute finding. Other: None. IMPRESSION: No acute intracranial abnormality seen. Electronically Signed   By: Lupita Raider M.D.   On: 01/01/2021 10:51   MR BRAIN WO CONTRAST  Result Date: 01/01/2021 CLINICAL DATA:  Transient ischemic attack (TIA) EXAM: MRI HEAD WITHOUT CONTRAST TECHNIQUE: Multiplanar, multiecho pulse sequences of the brain and surrounding structures were obtained without intravenous contrast. COMPARISON:  None. FINDINGS: Motion artifact is present. Brain: There is no acute infarction or intracranial hemorrhage. There is no intracranial mass, mass effect, or edema. There is no hydrocephalus or extra-axial fluid collection. 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 Additional chronic right cerebellar infarct. Patchy and confluent T2  hyperintensity in the supratentorial white matter is nonspecific but may reflect mild to moderate chronic microvascular ischemic changes. Prominence of the ventricles and sulci reflects parenchymal volume loss. Vascular: Major vessel flow voids at the skull base are preserved. Skull and upper cervical spine: Normal marrow signal is preserved. Sinuses/Orbits: Paranasal sinuses are aerated. Orbits are unremarkable. Other: Sella is unremarkable.  Mastoid air cells are clear. IMPRESSION: Degraded by motion artifact.  No acute infarct, hemorrhage, or mass. Chronic infarcts and chronic microvascular ischemic changes. Electronically Signed   By: Guadlupe Spanish M.D.   On: 01/01/2021 15:45   DG Chest Port 1 View  Result Date: 01/01/2021 CLINICAL DATA:  87 year old female with history of altered mental status and weakness. Atrial fibrillation. EXAM: PORTABLE CHEST 1 VIEW COMPARISON:  Chest x-ray 09/26/2018. FINDINGS: Lung volumes are low. No consolidative airspace disease. No pleural effusions. No pneumothorax. No pulmonary nodule or mass noted. Pulmonary vasculature is normal. Heart size is mildly enlarged. The patient is rotated to the left on today's exam, resulting in distortion of the mediastinal contours and reduced diagnostic sensitivity and specificity for mediastinal pathology. Atherosclerotic calcifications in the thoracic aorta. IMPRESSION: 1. Low lung volumes without radiographic evidence of acute cardiopulmonary disease. 2. Mild cardiomegaly. 3. Aortic  atherosclerosis. Electronically Signed   By: Trudie Reed M.D.   On: 01/01/2021 10:37    Positive ROS: All other systems have been reviewed and were otherwise negative with the exception of those mentioned in the HPI and as above.  Physical Exam: General:  Alert, no acute distress Psychiatric:  Patient is not competent for consent, but exhibits normal mood and affect   Cardiovascular:  No pedal edema Respiratory:  No wheezing, non-labored  breathing GI:  Abdomen is soft and non-tender Skin:  No lesions in the area of chief complaint Neurologic:  Sensation intact distally Lymphatic:  No axillary or cervical lymphadenopathy  Orthopedic Exam:  Orthopedic examination is limited to the right knee and lower extremity.  Skin inspection around the right knee is notable for mild swelling, as well as a 1-2+ effusion, but otherwise is unremarkable.  No erythema, ecchymosis, abrasions, or other skin abnormalities are identified.  She has at most minimal tenderness to palpation around the right knee.  Actively, she is able to extend her knee to within 5 degrees of full extension and flex her knee beyond 90 degrees without any discomfort.  Her patella tracks well and is without crepitance.  The knee is stable to varus valgus stressing.  She has a negative Lachman's test.  She is neurovascularly intact to the right lower extremity and foot.  X-rays:  AP and lateral x-rays of the right knee are available for review and have been reviewed by myself.  These films demonstrate no evidence for fractures, lytic lesions, or significant degenerative changes.  Assessment: Right knee effusion, probably secondary to early degenerative joint disease versus gout.  Plan: The treatment options have been discussed with the patient.  The patient is offered and accepts a right knee aspiration followed by a steroid injection.  After obtaining verbal consent, the right knee is aspirated sterilely of approximately 12 cc of moderately blood-tinged but otherwise clear fluid from the right knee before a solution of 8 cc of 0.5% Marcaine and 2 cc of Kenalog-40 (80 mg) was injected into the right knee.  The patient tolerated the procedure well.  The fluid will be sent off for cell count and differential, crystals, gram stain, and culture and sensitivity.  Thank you for asking me to participate in the care of this most pleasant woman.  I will be happy to follow her with  you.   Maryagnes Amos, MD  Beeper #:  [phone]  01/02/2021 4:55 PM

## 2021-01-02 NOTE — Evaluation (Signed)
Occupational Therapy Evaluation Navarro Details Name: Gabrielle Navarro MRN: [id number] DOB: [birth year] Today's Date: 01/02/2021   History of Present Illness Gabrielle Navarro is a 87 y.o. female with medical history significant of chronic A. fib on Eliquis, HTN, dementia/memory loss, GERD, chronic ambulation dysfunction baseline roller walker, presented with weakness and confusion. MD assessment includes TIA, weakness, elevated glucose, and hypothyroidism.   Clinical Impression   Gabrielle Navarro presents with generalized weakness, limited endurance, impaired balance, and R LE pain. PTA, she resided in an ALF where she received assistance with meals, housecleaning, bathing, and med mgmt. She was Mod I in dressing, toileting, and ambulation with RW. Gabrielle Gabrielle Navarro, yesterday at ALF, Gabrielle Navarro exhibited AMS and weakness: she did not recognize daughter, was unable to get out of bed on her own. During today's evaluation, Gabrielle Navarro required Mod A for bed mobility and transfers and was unable to maintain static standing balance for more that ~ 5 seconds. She reports R LE pain, from knee to ankle. Recommend ongoing OT while hospitalized, with DC to SNF to assist Gabrielle Navarro to regain strength, endurance, and ability to [facility] perform ADLs, enabling her to return to her ALF.   Recommendations for follow up therapy are one component of a multi-disciplinary discharge planning process, led by the attending physician.  Recommendations may be updated based on Navarro status, additional functional criteria and insurance authorization.   Follow Up Recommendations  SNF    Equipment Recommendations  None recommended by OT    Recommendations for Other Services       Precautions / Restrictions Precautions Precautions: Fall Restrictions Weight Bearing Restrictions: No      Mobility Bed Mobility Overal bed mobility: Needs Assistance Bed Mobility: Supine to Sit;Sit to Supine     Supine to sit: Mod assist;HOB elevated Sit to supine: Mod assist   General  bed mobility comments: Mod A for BLE and trunk control    Transfers Overall transfer level: Needs assistance Equipment used: Rolling walker (2 wheeled) Transfers: Sit to/from Stand;Lateral/Scoot Transfers Sit to Stand: Min assist;From elevated surface        Lateral/Scoot Transfers: Supervision General transfer comment: Gabrielle Navarro unable to come to full upright position, guarded secondary to R knee and foot pain with WB; Gabrielle Navarro able to laterally scoot around one foot with multiple attempts and cuing for sequencing    Balance Overall balance assessment: Needs assistance Sitting-balance support: Feet supported;Bilateral upper extremity supported Sitting balance-Leahy Scale: Good     Standing balance support: Bilateral upper extremity supported Standing balance-Leahy Scale: Poor Standing balance comment: Max lean on the RW for support along with physical assist to maintain standing position, limited by R knee pain and by weakness                           ADL either performed or assessed with clinical judgement   ADL Overall ADL's : Needs assistance/impaired Eating/Feeding: Independent   Grooming: Wash/dry hands;Wash/dry face;Brushing hair;Independent;Set up               Lower Body Dressing: Modified independent Lower Body Dressing Details (indicate cue type and reason): donning/doffing footwear                     Vision         Perception     Praxis      Pertinent Vitals/Pain Pain Assessment: Faces Faces Pain Scale: Hurts even more Pain Location: R knee and foot with movement and  weightbearing, no pain at rest Pain Descriptors / Indicators: Grimacing;Guarding Pain Intervention(s): Repositioned;Monitored during session;Limited activity within Navarro's tolerance     Hand Dominance     Extremity/Trunk Assessment Upper Extremity Assessment Upper Extremity Assessment: Generalized weakness   Lower Extremity Assessment Lower Extremity Assessment:  Generalized weakness;RLE deficits/detail RLE Deficits / Details: No pain at rest but painful with AROM, PROM, and WB through the RLE RLE: Unable to fully assess due to pain       Communication Communication Communication: HOH   Cognition Arousal/Alertness: Awake/alert Behavior During Therapy: WFL for tasks assessed/performed Overall Cognitive Status: Impaired/Different from baseline Area of Impairment: Following commands                       Following Commands: Follows one step commands with increased time       General Comments: Gabrielle daughter Navarro close to cognitive baseline but with some increased difficulty following commands   General Comments       Exercises Total Joint Exercises Long Arc Quad: AROM;Strengthening;Both;10 reps (minimal amplitude on the RLE) Knee Flexion: AROM;Strengthening;Both;10 reps (minimal amplitude on the RLE) Other Exercises Other Exercises: Educ for Gabrielle Navarro and daughter re: role of OT, POC, DC recs   Shoulder Instructions      Home Living Family/Navarro expects to be discharged to:: Assisted living                             Home Equipment: Walker - 4 wheels;Walker - 2 wheels          Prior Functioning/Environment Level of Independence: Needs assistance  Gait / Transfers Assistance Needed: Gabrielle Navarro Mod Ind with a ambulation with a rollator facility distances at [facility] in the memory care unit, no fall history, Ind with transfers and bed mobility ADL's / Homemaking Assistance Needed: Gabrielle Navarro Ind with toileting (Gabrielle Navarro is incontinent but takes herself to/from the bathroom), Ind with dressing, needs assist from staff with bathing, meals, and meds            OT Problem List: Decreased strength;Impaired balance (sitting and/or standing);Decreased cognition;Pain;Decreased range of motion;Decreased activity tolerance;Decreased coordination      OT Treatment/Interventions: Self-care/ADL training;Therapeutic activities;Balance  training;Navarro/family education;Therapeutic exercise    OT Goals(Current goals can be found in the care plan section) Acute Rehab OT Goals Navarro Stated Goal: To be able to walk again OT Goal Formulation: With Navarro Time For Goal Achievement: 01/16/21 Potential to Achieve Goals: Good ADL Goals Gabrielle Navarro Will Perform Lower Body Bathing: sitting/lateral leans;with modified independence Gabrielle Navarro Will Perform Lower Body Dressing: sitting/lateral leans;sit to/from stand;with modified independence Gabrielle Navarro Will Transfer to Toilet: with modified independence (using LRAD)  OT Frequency: Min 1X/week   Barriers to D/C: Decreased caregiver support  ALF where Gabrielle Navarro lives can not provide assistance with bed mobility, dressing, toileting       Co-evaluation              AM-PAC OT "6 Clicks" Daily Activity     Outcome Measure Help from another person eating meals?: None Help from another person taking care of personal grooming?: A Little Help from another person toileting, which includes using toliet, bedpan, or urinal?: A Lot Help from another person bathing (including washing, rinsing, drying)?: A Lot Help from another person to put on and taking off regular upper body clothing?: A Little Help from another person to put on and taking off regular lower body clothing?: A Lot 6  Click Score: 16   End of Session Equipment Utilized During Treatment: Rolling walker  Activity Tolerance: Navarro limited by pain;Navarro limited by lethargy;Navarro tolerated treatment well Navarro left: in bed;with call bell/phone within reach;with bed alarm set;with family/visitor present  OT Visit Diagnosis: Unsteadiness on feet (R26.81);Muscle weakness (generalized) (M62.81);Other abnormalities of gait and mobility (R26.89)                Time: 9169-4503 OT Time Calculation (min): 29 min Charges:  OT General Charges $OT Visit: 1 Visit OT Evaluation $OT Eval Low Complexity: 1 Low OT Treatments $Self Care/Home Management :  23-37 mins Latina Craver, PhD, MS, OTR/L 01/02/21, 11:31 AM

## 2021-01-02 NOTE — Progress Notes (Signed)
*  PRELIMINARY RESULTS* Echocardiogram 2D Echocardiogram has been performed.  Gabrielle Navarro 01/02/2021, 5:14 PM

## 2021-01-02 NOTE — NC FL2 (Signed)
[place] MEDICAID FL2 LEVEL OF CARE SCREENING TOOL     IDENTIFICATION  Patient Name: Gabrielle Navarro Birthdate: [birth year] Sex: female Admission Date (Current Location): 01/01/2021  [facility] and [place] Number:  Chiropodist and Address:  [facility], [address] [place], [place] [address]      Provider Number: [id number]  Attending Physician Name and Address:  Alford Highland, MD  Relative Name and Phone Number:  Sherri Sear [phone]    Current Level of Care: SNF Recommended Level of Care: Skilled Nursing Facility Prior Approval Number:    Date Approved/Denied:   PASRR Number: pending  MUST down for maintenance  Discharge Plan: SNF    Current Diagnoses: Patient Active Problem List   Diagnosis Date Noted   Acute pain of right knee    Pain of toe of right foot    Dehydration    Impaired fasting glucose    Anxiety and depression    Weakness 01/01/2021   TIA (transient ischemic attack) 01/01/2021   Facial cellulitis    Dental abscess    Cellulitis 10/03/2020   Atrial fibrillation, chronic (HCC) 09/06/2019   Dementia (HCC) 09/06/2019   HOH (hard of hearing) 09/06/2019   GERD (gastroesophageal reflux disease) 09/06/2019   Essential hypertension 09/06/2019   Hypothyroidism 09/06/2019   Syncope and collapse 09/06/2019    Orientation RESPIRATION BLADDER Height & Weight     Self, Time, Situation, Place  Normal Incontinent Weight: 115 lb 1.3 oz (52.2 kg) Height:  5\' 5"  (165.1 cm)  BEHAVIORAL SYMPTOMS/MOOD NEUROLOGICAL BOWEL NUTRITION STATUS      Continent Diet  AMBULATORY STATUS COMMUNICATION OF NEEDS Skin   Extensive Assist Verbally Normal                       Personal Care Assistance Level of Assistance  Bathing, Feeding, Dressing Bathing Assistance: Limited assistance Feeding assistance: Limited assistance Dressing Assistance: Limited assistance     Functional Limitations Info  Sight, Hearing,  Speech Sight Info: Adequate Hearing Info: Impaired Speech Info: Adequate    SPECIAL CARE FACTORS FREQUENCY  PT (By licensed PT), OT (By licensed OT)     PT Frequency: 5x per week OT Frequency: 5x per week            Contractures Contractures Info: Not present    Additional Factors Info  Code Status, Allergies Code Status Info: full code Allergies Info: Amoxicillan, shellfish, chocolate flavor, sulfa antibiotics, latex           Current Medications (01/02/2021):  This is the current hospital active medication list Current Facility-Administered Medications  Medication Dose Route Frequency Provider Last Rate Last Admin    stroke: mapping our early stages of recovery book   Does not apply Once 01/04/2021 T, MD       acetaminophen (TYLENOL) tablet 650 mg  650 mg Oral Q4H PRN Mikey College, MD       Or   acetaminophen (TYLENOL) 160 MG/5ML solution 650 mg  650 mg Per Tube Q4H PRN Emeline General T, MD       Or   acetaminophen (TYLENOL) suppository 650 mg  650 mg Rectal Q4H PRN Mikey College T, MD       apixaban Mikey College) tablet 2.5 mg  2.5 mg Oral BID Everlene Balls T, MD   2.5 mg at 01/02/21 0840   carvedilol (COREG) tablet 6.25 mg  6.25 mg Oral BID WC 01/04/21, MD   6.25  mg at 01/02/21 0841   lactose free nutrition (Boost) liquid 237 mL  237 mL Oral TID BM Mikey College T, MD   237 mL at 01/02/21 0841   levothyroxine (SYNTHROID) tablet 25 mcg  25 mcg Oral Q0600 Mikey College T, MD   25 mcg at 01/02/21 0538   memantine (NAMENDA XR) 24 hr capsule 28 mg  28 mg Oral Daily Mikey College T, MD   28 mg at 01/02/21 0841   mirtazapine (REMERON) tablet 7.5 mg  7.5 mg Oral QHS Mikey College T, MD   7.5 mg at 01/01/21 2347   pantoprazole (PROTONIX) EC tablet 40 mg  40 mg Oral Daily Mikey College T, MD   40 mg at 01/02/21 0840   senna-docusate (Senokot-S) tablet 1 tablet  1 tablet Oral QHS PRN Emeline General, MD       traZODone (DESYREL) tablet 100 mg  100 mg Oral QHS Mikey College T, MD   100 mg at  01/01/21 2347     Discharge Medications: Please see discharge summary for a list of discharge medications.  Relevant Imaging Results:  Relevant Lab Results:   Additional Information SS#[id number]  Verna Czech [place], [place]

## 2021-01-03 DIAGNOSIS — E039 Hypothyroidism, unspecified: Secondary | ICD-10-CM

## 2021-01-03 DIAGNOSIS — F0390 Unspecified dementia without behavioral disturbance: Secondary | ICD-10-CM

## 2021-01-03 DIAGNOSIS — N179 Acute kidney failure, unspecified: Secondary | ICD-10-CM | POA: Diagnosis not present

## 2021-01-03 DIAGNOSIS — M1120 Other chondrocalcinosis, unspecified site: Secondary | ICD-10-CM

## 2021-01-03 DIAGNOSIS — M79674 Pain in right toe(s): Secondary | ICD-10-CM | POA: Diagnosis not present

## 2021-01-03 DIAGNOSIS — M25561 Pain in right knee: Secondary | ICD-10-CM | POA: Diagnosis not present

## 2021-01-03 LAB — BASIC METABOLIC PANEL
Anion gap: 8 (ref 5–15)
BUN: 21 mg/dL (ref 8–23)
CO2: 23 mmol/L (ref 22–32)
Calcium: 9.4 mg/dL (ref 8.9–10.3)
Chloride: 105 mmol/L (ref 98–111)
Creatinine, Ser: 0.52 mg/dL (ref 0.44–1.00)
GFR, Estimated: >60 mL/min (ref >60)
Glucose, Bld: 228 mg/dL — ABNORMAL HIGH (ref 70–99)
Potassium: 4.2 mmol/L (ref 3.5–5.1)
Sodium: 136 mmol/L (ref 135–145)

## 2021-01-03 LAB — ECHOCARDIOGRAM COMPLETE
AR max vel: 1.44 cm2
AV Peak grad: 3.2 mmHg
Ao pk vel: 0.9 m/s
Area-P 1/2: 9.6 cm2
Height: 65 in
S' Lateral: 2.63 cm
Weight: 1841.28 oz

## 2021-01-03 LAB — CBC
HCT: 42.4 % (ref 36.0–46.0)
Hemoglobin: 14.4 g/dL (ref 12.0–15.0)
MCH: 31.5 pg (ref 26.0–34.0)
MCHC: 34 g/dL (ref 30.0–36.0)
MCV: 92.8 fL (ref 80.0–100.0)
Platelets: 169 10*3/uL (ref 150–400)
RBC: 4.57 MIL/uL (ref 3.87–5.11)
RDW: 12.7 % (ref 11.5–15.5)
WBC: 6.5 10*3/uL (ref 4.0–10.5)
nRBC: 0 % (ref 0.0–0.2)

## 2021-01-03 MED ORDER — LOSARTAN POTASSIUM 50 MG PO TABS
50.0000 mg | ORAL_TABLET | Freq: Two times a day (BID) | ORAL | Status: DC
  Administered 2021-01-03 – 2021-01-05 (×5): 50 mg via ORAL
  Filled 2021-01-03 (×5): qty 1

## 2021-01-03 NOTE — Progress Notes (Deleted)
Patient ID: Gabrielle Navarro, female   DOB: [birth year], 87 y.o.   MRN: [id number]  Subjective: No specific complaints today, especially pertaining to her knees.  Her knee aspirate results show calcium pyrophosphate crystals, consistent with pseudogout.   Objective: Vital signs in last 24 hours: Temp:  [97.7 F (36.5 C)-98.6 F (37 C)] 97.7 F (36.5 C) (10/16 0801) Pulse Rate:  [78-95] 81 (10/16 0831) Resp:  [16-20] 18 (10/16 0801) BP: (129-191)/(68-105) 177/81 (10/16 0831) SpO2:  [94 %-98 %] 98 % (10/16 0801)  Intake/Output from previous day: 10/15 0701 - 10/16 0700 In: 240 [P.O.:240] Out: 1700 [Urine:1700] Intake/Output this shift: No intake/output data recorded.  Recent Labs    01/01/21 0951 01/03/21 0521  HGB 14.3 14.4   Recent Labs    01/01/21 0951 01/03/21 0521  WBC 7.6 6.5  RBC 4.43 4.57  HCT 41.6 42.4  PLT 177 169   Recent Labs    01/01/21 0951 01/03/21 0521  NA 136 136  K 3.9 4.2  CL 101 105  CO2 26 23  BUN 40* 21  CREATININE 0.87 0.52  GLUCOSE 178* 228*  CALCIUM 9.6 9.4   No results for input(s): LABPT, INR in the last 72 hours.  Physical Exam: Orthopedic examination again is limited to the right knee and lower extremity.  The swelling around the knee appears to be improved as compared to yesterday.  She also has only a small effusion today as compared to yesterday.  No erythema, ecchymosis, abrasions, or other skin abnormalities are identified.  She is able to freely move her knee from 0 to 90 degrees without pain.  She remains neurovascularly intact to the left lower extremity and foot.    Assessment: Right knee effusion secondary to pseudogout.  Plan: The treatment options have been reviewed with the patient.  She may be mobilized with physical therapy, weightbearing as tolerated to both lower extremities as symptoms permit.   Gabrielle Navarro 01/03/2021, 9:39 AM

## 2021-01-03 NOTE — Progress Notes (Signed)
CSW received a call back from [facility] confirming that they could take patient for SNF placement. Tiffany told CSW that they can take patient tomorrow.

## 2021-01-03 NOTE — Progress Notes (Signed)
CSW updated patients daughter Elease Hashimoto about her mother being accepted at [facility].

## 2021-01-03 NOTE — Progress Notes (Signed)
Patient ID: Gabrielle Navarro, female   DOB: 12/28/1927, 87 y.o.   MRN: [id number] [facility] PROGRESS NOTE  Gabrielle Navarro QQI:[id number] DOB: 1928-03-06 DOA: 01/01/2021 PCP: Gabrielle Rang, PA-C  HPI/Subjective: Patient admitted with the inability to walk.  Yesterday complained of right knee pain and we diagnosed pseudogout.  Patient today talking about her left knee was replaced.  Did not mention anything about right knee pain.  Not having any right toe pain either.  Objective: Vitals:   01/03/21 0831 01/03/21 1056  BP: (!) 177/81 (!) 152/76  Pulse: 81   Resp:    Temp:    SpO2:      Intake/Output Summary (Last 24 hours) at 01/03/2021 1117 Last data filed at 01/03/2021 0600 Gross per 24 hour  Intake 0 ml  Output 1400 ml  Net -1400 ml   Filed Weights   01/01/21 0939  Weight: 52.2 kg    ROS: Review of Systems  Respiratory:  Negative for shortness of breath.   Cardiovascular:  Negative for chest pain.  Gastrointestinal:  Negative for abdominal pain, nausea and vomiting.  Musculoskeletal:  Negative for joint pain.  Exam: Physical Exam HENT:     Head: Normocephalic.     Mouth/Throat:     Pharynx: No oropharyngeal exudate.  Eyes:     General: Lids are normal.     Conjunctiva/sclera: Conjunctivae normal.  Cardiovascular:     Rate and Rhythm: Normal rate and regular rhythm.     Heart sounds: Normal heart sounds, S1 normal and S2 normal.  Pulmonary:     Breath sounds: No decreased breath sounds, wheezing, rhonchi or rales.  Abdominal:     Palpations: Abdomen is soft.     Tenderness: There is no abdominal tenderness.  Musculoskeletal:     Right lower leg: No swelling.     Left lower leg: No swelling.  Skin:    General: Skin is warm.     Findings: No rash.  Neurological:     Mental Status: She is alert.     Comments: Answers some yes or no questions appropriately.      Scheduled Meds:   stroke: mapping our early stages of recovery book   Does not apply Once    apixaban  2.5 mg Oral BID   carvedilol  6.25 mg Oral BID WC   lactose free nutrition  237 mL Oral TID BM   levothyroxine  25 mcg Oral Q0600   losartan  50 mg Oral BID   memantine  28 mg Oral Daily   mirtazapine  7.5 mg Oral QHS   pantoprazole  40 mg Oral Daily   traZODone  100 mg Oral QHS    Assessment/Plan:  Right knee pain and right first toe pain.  Patient had right knee aspiration with crystals consistent with pseudogout.  She was given 1 dose of Naprosyn and 1 dose of colchicine and Dr. Joice Lofts did an aspiration and injection with steroid.  No complaints of pain today.  We will hold off on further renal dosing of systemic medication since no pain.  We will continue to monitor. Weakness and inability to walk.  Physical therapy recommends rehab versus 24-hour supervision.  She currently resides in [facility] assisted living and they will not take her back unless she is able to walk.  MRI of the brain negative for acute event.  Likely will end up needing rehab. Acute kidney injury with dehydration.  Creatinine 0.87 improved to 0.52 with IV fluid bolus. Essential hypertension.  Blood pressure higher this morning.  Restart losartan.  Already on Coreg. Chronic atrial fibrillation on Coreg for rate control and Eliquis for anticoagulation Hypothyroidism unspecified on Synthroid Dementia without behavioral disturbance on Namenda Anxiety and depression on trazodone and Remeron at night Impaired fasting glucose.  Hemoglobin A1c 6.3.  Follow-up as outpatient.    Code Status:     Code Status Orders  (From admission, onward)           Start     Ordered   01/01/21 1410  Full code  Continuous        01/01/21 1410           Code Status History     Date Active Date Inactive Code Status Order ID Comments User Context   10/03/2020 1703 10/05/2020 1921 Full Code [id number]  Lovenia Kim, DO ED   09/07/2019 0111 09/08/2019 0154 Full Code [id number]  Rometta Emery, MD ED      Advance  Directive Documentation    Flowsheet Row Most Recent Value  Type of Advance Directive Healthcare Power of Attorney  Pre-existing out of facility DNR order (yellow form or pink MOST form) --  "MOST" Form in Place? --      Family Communication: Updated patient's daughter on the phone Disposition Plan: Status is: Inpatient  Consultants: Orthopedic surgery  Procedures: Right knee aspiration and steroid injection by Dr. Joice Lofts  Time spent: 26 minutes  Koda Routon [facility]  [facility]

## 2021-01-03 NOTE — Progress Notes (Signed)
CSW left a message with [employer] requesting a call back.

## 2021-01-04 DIAGNOSIS — M25561 Pain in right knee: Secondary | ICD-10-CM | POA: Diagnosis not present

## 2021-01-04 DIAGNOSIS — R531 Weakness: Secondary | ICD-10-CM | POA: Diagnosis not present

## 2021-01-04 DIAGNOSIS — M1120 Other chondrocalcinosis, unspecified site: Secondary | ICD-10-CM | POA: Diagnosis not present

## 2021-01-04 DIAGNOSIS — M79674 Pain in right toe(s): Secondary | ICD-10-CM | POA: Diagnosis not present

## 2021-01-04 LAB — RESP PANEL BY RT-PCR (FLU A&B, COVID) ARPGX2
Influenza A by PCR: NEGATIVE
Influenza B by PCR: NEGATIVE
SARS Coronavirus 2 by RT PCR: NEGATIVE

## 2021-01-04 NOTE — Progress Notes (Signed)
Physical Therapy Treatment Patient Details Name: Gabrielle Navarro MRN: [id number] DOB: [birth year] Today's Date: 01/04/2021   History of Present Illness Pt is a 87 y.o. female with medical history significant of chronic A. fib on Eliquis, HTN, dementia/memory loss, GERD, chronic ambulation dysfunction baseline roller walker, presented with weakness and confusion. MD assessment includes TIA, weakness, elevated glucose, and hypothyroidism.    PT Comments    Pt ready for session.  To EOB with min guard and steady in sitting with distant supervision.  [name] and transfer to commode.  She is given RW but opts to reach for handrails on commode vs using RW.  She has a very large soft BM and care is provided.  [name] for care with cues and then transfer back to EOB.  Seated rest while commode is removed and transfers to recliner to sit and visit with family.  Needs met and in reach.  She does not c/o foot pain today and does not seem to limit mobility.  General weakness remains and she needs +1 assist for care.  Discussed with family.  Pt lives in [place] and is independent with mobility/care.  ALF is not able to provide increased assist as she needs at this time.  SNF will allow for increased strength, mobility and safety in hopes of transition back to ALF.    Recommendations for follow up therapy are one component of a multi-disciplinary discharge planning process, led by the attending physician.  Recommendations may be updated based on patient status, additional functional criteria and insurance authorization.  Follow Up Recommendations  SNF;Supervision/Assistance - 24 hour     Equipment Recommendations  None recommended by PT    Recommendations for Other Services       Precautions / Restrictions Precautions Precautions: Fall Restrictions Weight Bearing Restrictions: No     Mobility  Bed Mobility Overal bed mobility: Needs Assistance Bed Mobility: Supine to Sit      Supine to sit: Min guard          Transfers Overall transfer level: Needs assistance Equipment used: Rolling walker (2 wheeled) Transfers: Sit to/from Stand Sit to Stand: Min assist         General transfer comment: uses walker with cues but prefers to reach for handrails on chair/commode for more of a aquat pivot transfer  Ambulation/Gait         Gait velocity: decrased   General Gait Details: not true gait but does well stepping for transfer today   Stairs             Wheelchair Mobility    Modified Rankin (Stroke Patients Only)       Balance Overall balance assessment: Needs assistance Sitting-balance support: Feet supported Sitting balance-Leahy Scale: Good Sitting balance - Comments: steady in sitting with distant supervision   Standing balance support: Bilateral upper extremity supported Standing balance-Leahy Scale: Poor Standing balance comment: min assist throughout transfer and standing for pericare.                            Cognition Arousal/Alertness: Awake/alert Behavior During Therapy: WFL for tasks assessed/performed Overall Cognitive Status: Within Functional Limits for tasks assessed                                 General Comments: HOH makes interactions challenging  Exercises Other Exercises Other Exercises: to commode for BM/void  XXL soft BM - relayed to RN    General Comments        Pertinent Vitals/Pain Pain Assessment: No/denies pain    Home Living                      Prior Function            PT Goals (current goals can now be found in the care plan section) Progress towards PT goals: Progressing toward goals    Frequency    Min 2X/week      PT Plan Current plan remains appropriate    Co-evaluation              AM-PAC PT "6 Clicks" Mobility   Outcome Measure  Help needed turning from your back to your side while in a flat bed without using  bedrails?: A Little Help needed moving from lying on your back to sitting on the side of a flat bed without using bedrails?: A Little Help needed moving to and from a bed to a chair (including a wheelchair)?: A Little Help needed standing up from a chair using your arms (e.g., wheelchair or bedside chair)?: A Little Help needed to walk in hospital room?: A Lot Help needed climbing 3-5 steps with a railing? : Total 6 Click Score: 15    End of Session Equipment Utilized During Treatment: Gait belt Activity Tolerance: Patient tolerated treatment well Patient left: in chair;with call bell/phone within reach;with chair alarm set;with family/visitor present Nurse Communication: Mobility status;Other (comment) (BM) PT Visit Diagnosis: Unsteadiness on feet (R26.81);Difficulty in walking, not elsewhere classified (R26.2);Muscle weakness (generalized) (M62.81);Pain Pain - Right/Left: Right Pain - part of body: Knee;Ankle and joints of foot     Time: 9412-9047 PT Time Calculation (min) (ACUTE ONLY): 23 min  Charges:  $Therapeutic Activity: 23-37 mins                    Jessicalynn Deshong, PTA 01/04/21, 9:43 AM

## 2021-01-04 NOTE — TOC Progression Note (Signed)
Transition of Care [facility]) - Progression Note    Patient Details  Name: Gabrielle Navarro MRN: [id number] Date of Birth: 26-Feb-1928  [facility] of [facility]) CM/SW Contact  Allayne Butcher, RN Phone Number: 01/04/2021, 2:06 PM  Clinical Narrative:    Gabrielle Navarro has accepted patient for short term rehab.  [facility] can accept patient tomorrow.  Patient's daughter updated.   Expected Discharge Plan: Skilled Nursing Facility Barriers to Discharge: Continued Medical Work up  Expected Discharge Plan and Services Expected Discharge Plan: Skilled Nursing Facility In-house Referral: Clinical Social Work   Post Acute Care Choice: Skilled Nursing Facility Living arrangements for the past 2 months: Assisted Living Facility ([facility])                                       Social Determinants of Health (SDOH) Interventions    Readmission Risk Interventions No flowsheet data found.

## 2021-01-04 NOTE — Progress Notes (Signed)
Patient ID: Gabrielle Navarro, female   DOB: [birth year], 87 y.o.   MRN: [id number]  Subjective: The patient has no complaints regarding her right knee this morning.   Objective: Vital signs in last 24 hours: Temp:  [97.4 F (36.3 C)-98.2 F (36.8 C)] 97.9 F (36.6 C) (10/17 0713) Pulse Rate:  [81-99] 86 (10/17 0713) Resp:  [14-18] 16 (10/17 0713) BP: (144-191)/(66-105) 159/80 (10/17 0713) SpO2:  [92 %-98 %] 97 % (10/17 0713)  Intake/Output from previous day: 10/16 0701 - 10/17 0700 In: 120 [P.O.:120] Out: 400 [Urine:400] Intake/Output this shift: No intake/output data recorded.  Recent Labs    01/01/21 0951 01/03/21 0521  HGB 14.3 14.4   Recent Labs    01/01/21 0951 01/03/21 0521  WBC 7.6 6.5  RBC 4.43 4.57  HCT 41.6 42.4  PLT 177 169   Recent Labs    01/01/21 0951 01/03/21 0521  NA 136 136  K 3.9 4.2  CL 101 105  CO2 26 23  BUN 40* 21  CREATININE 0.87 0.52  GLUCOSE 178* 228*  CALCIUM 9.6 9.4   No results for input(s): LABPT, INR in the last 72 hours.  Physical Exam: Orthopedic examination is limited to the right knee and lower extremity.  There is perhaps a trace residual effusion in the knee, but otherwise her inspection is unremarkable.  No swelling, erythema, ecchymosis, abrasions, or other skin abnormalities are identified.  Actively, she is able to flex and extend her knee fully without any pain or catching.  There is no tenderness to palpation around the knee.  She is neurovascularly intact to the right lower extremity and foot.  Assessment: Right knee pain and effusion secondary to pseudogout, presently improved symptomatically.   Plan: The patient may continue to be mobilized with physical therapy, weightbearing as tolerated on the right lower extremity.  Thank you for asking me to participate in the care of this most pleasant woman.  I will sign off at this time.  She may follow-up in my office on an as necessary basis.  If you have further need for  orthopedic input during this hospitalization, please reconsult me.   Excell Seltzer Yarianna Varble 01/04/2021, 7:58 AM

## 2021-01-04 NOTE — Progress Notes (Signed)
Patient ID: Gabrielle Navarro, female   DOB: 11/04/1927, 87 y.o.   MRN: [id number]  Subjective: No specific complaints today, especially pertaining to her knees.  Her knee aspirate results show calcium pyrophosphate crystals, consistent with pseudogout.   Objective: Vital signs in last 24 hours: Temp:  [97.7 F (36.5 C)-98.6 F (37 C)] 97.7 F (36.5 C) (10/16 0801) Pulse Rate:  [78-95] 81 (10/16 0831) Resp:  [16-20] 18 (10/16 0801) BP: (129-191)/(68-105) 177/81 (10/16 0831) SpO2:  [94 %-98 %] 98 % (10/16 0801)  Intake/Output from previous day: 10/15 0701 - 10/16 0700 In: 240 [P.O.:240] Out: 1700 [Urine:1700] Intake/Output this shift: No intake/output data recorded.  Recent Labs    01/01/21 0951 01/03/21 0521  HGB 14.3 14.4   Recent Labs    01/01/21 0951 01/03/21 0521  WBC 7.6 6.5  RBC 4.43 4.57  HCT 41.6 42.4  PLT 177 169   Recent Labs    01/01/21 0951 01/03/21 0521  NA 136 136  K 3.9 4.2  CL 101 105  CO2 26 23  BUN 40* 21  CREATININE 0.87 0.52  GLUCOSE 178* 228*  CALCIUM 9.6 9.4   No results for input(s): LABPT, INR in the last 72 hours.  Physical Exam: Orthopedic examination again is limited to the right knee and lower extremity.  The swelling around the knee appears to be improved as compared to yesterday.  She also has only a small effusion today as compared to yesterday.  No erythema, ecchymosis, abrasions, or other skin abnormalities are identified.  She is able to freely move her knee from 0 to 90 degrees without pain.  She remains neurovascularly intact to the left lower extremity and foot.    Assessment: Right knee effusion secondary to pseudogout.  Plan: The treatment options have been reviewed with the patient.  She may be mobilized with physical therapy, weightbearing as tolerated to both lower extremities as symptoms permit.   Branston Halsted J Skylen Danielsen 01/03/2021, 9:39 AM      

## 2021-01-04 NOTE — Progress Notes (Signed)
Patient ID: Gabrielle Navarro, female   DOB: [birth year], 87 y.o.   MRN: [id number] [facility] PROGRESS NOTE  Gabrielle Navarro UGQ:[id number] DOB: 06/09/1927 DOA: 01/01/2021 PCP: Carren Rang, PA-C  HPI/Subjective: Patient with difficulty hearing.  No complaints of right knee pain even when I move it.  No complaints of right toe pain.  Feels okay.  Objective: Vitals:   01/04/21 0713 01/04/21 1120  BP: (!) 159/80 135/77  Pulse: 86 89  Resp: 16 18  Temp: 97.9 F (36.6 C) 97.9 F (36.6 C)  SpO2: 97% 95%    Intake/Output Summary (Last 24 hours) at 01/04/2021 1618 Last data filed at 01/04/2021 1021 Gross per 24 hour  Intake 600 ml  Output 400 ml  Net 200 ml   Filed Weights   01/01/21 0939  Weight: 52.2 kg    ROS: Review of Systems  Unable to perform ROS: Other  Gastrointestinal:  Negative for abdominal pain.  Musculoskeletal:  Negative for joint pain.  Patient very hard of hearing. Exam: Physical Exam HENT:     Head: Normocephalic.     Mouth/Throat:     Pharynx: No oropharyngeal exudate.  Eyes:     General: Lids are normal.     Conjunctiva/sclera: Conjunctivae normal.  Cardiovascular:     Rate and Rhythm: Normal rate. Rhythm irregularly irregular.     Heart sounds: Normal heart sounds, S1 normal and S2 normal.  Pulmonary:     Breath sounds: No decreased breath sounds, wheezing, rhonchi or rales.  Abdominal:     Palpations: Abdomen is soft.     Tenderness: There is no abdominal tenderness.  Musculoskeletal:     Right lower leg: No swelling.     Left lower leg: No swelling.     Comments: Able to bend right knee without patient complaining of pain.  Skin:    General: Skin is warm.     Findings: No rash.  Neurological:     Mental Status: She is alert.      Scheduled Meds:   stroke: mapping our early stages of recovery book   Does not apply Once   apixaban  2.5 mg Oral BID   carvedilol  6.25 mg Oral BID WC   lactose free nutrition  237 mL Oral TID BM    levothyroxine  25 mcg Oral Q0600   losartan  50 mg Oral BID   memantine  28 mg Oral Daily   mirtazapine  7.5 mg Oral QHS   pantoprazole  40 mg Oral Daily   traZODone  100 mg Oral QHS     Assessment/Plan:  Pseudogout right knee and right first toe.  Good range of motion right knee without pain.  Patient had right knee steroid injection by Dr. Joice Lofts.  Initially received 1 dose of Naprosyn and 1 dose of colchicine.  Holding off on renal dosing since the patient not having any pain today. Weakness and inability to walk.  Physical therapy still recommending rehab at this point.  We will go up to [facility].  MRI of the brain negative for acute event Acute kidney injury with dehydration.  Creatinine 0.87 and improved to 0.52 with IV fluid bolus. Essential hypertension better controlled on losartan and Coreg. Chronic atrial fibrillation on Eliquis for anticoagulation and Coreg for rate control. Hypothyroidism unspecified.  Continue Synthroid Dementia without behavioral disturbance on Namenda Anxiety and depression on trazodone and Remeron at night Impaired fasting glucose.  Hemoglobin A1c 6.3        Code  Status:     Code Status Orders  (From admission, onward)           Start     Ordered   01/01/21 1410  Full code  Continuous        01/01/21 1410           Code Status History     Date Active Date Inactive Code Status Order ID Comments User Context   10/03/2020 1703 10/05/2020 1921 Full Code [id number]  Lovenia Kim, DO ED   09/07/2019 0111 09/08/2019 0154 Full Code [id number]  Rometta Emery, MD ED      Advance Directive Documentation    Flowsheet Row Most Recent Value  Type of Advance Directive Healthcare Power of Attorney  Pre-existing out of facility DNR order (yellow form or pink MOST form) --  "MOST" Form in Place? --      Family Communication: Spoke with patient's daughter at the bedside Disposition Plan: Status is: Inpatient  Time spent: 26  minutes  Roshard Rezabek [employer]

## 2021-01-05 DIAGNOSIS — M79674 Pain in right toe(s): Secondary | ICD-10-CM | POA: Diagnosis not present

## 2021-01-05 DIAGNOSIS — R531 Weakness: Secondary | ICD-10-CM | POA: Diagnosis not present

## 2021-01-05 DIAGNOSIS — M25561 Pain in right knee: Secondary | ICD-10-CM | POA: Diagnosis not present

## 2021-01-05 DIAGNOSIS — M1120 Other chondrocalcinosis, unspecified site: Secondary | ICD-10-CM | POA: Diagnosis not present

## 2021-01-05 MED ORDER — SENNOSIDES-DOCUSATE SODIUM 8.6-50 MG PO TABS: 1.0000 | ORAL_TABLET | Freq: Every evening | ORAL | Status: DC | PRN

## 2021-01-05 MED ORDER — ACETAMINOPHEN 325 MG PO TABS: 650.0000 mg | ORAL_TABLET | Freq: Four times a day (QID) | ORAL | Status: DC | PRN

## 2021-01-05 NOTE — TOC Transition Note (Signed)
Transition of Care [facility]) - CM/SW Discharge Note   Patient Details  Name: Gabrielle Navarro MRN: [id number] Date of Birth: 06-09-1927  Transition of Care [facility]) CM/SW Contact:  Allayne Butcher, RN Phone Number: 01/05/2021, 8:57 AM   Clinical Narrative:    Patient medically cleared for discharge to [facility] today.  Patient will be going to room 510 and bedside RN will call report.  Once report is called RNCM will arrange EMS.  Patient's daughter updated on discharge for this morning.     Final next level of care: Skilled Nursing Facility Barriers to Discharge: Barriers Resolved   Patient Goals and CMS Choice Patient states their goals for this hospitalization and ongoing recovery are:: Daughter wants patient to go to [facility] for Therapy CMS Medicare.gov Compare Post Acute Care list provided to:: Patient Represenative (must comment) Choice offered to / list presented to : Adult Children  Discharge Placement              Patient chooses bed at: [facility] Patient to be transferred to facility by: [facility] Name of family member notified: Ninetta Lights Philips Patient and family notified of of transfer: 01/05/21  Discharge Plan and Services In-house Referral: Clinical Social Work   Post Acute Care Choice: Skilled Nursing Facility          DME Arranged: N/A DME Agency: NA       HH Arranged: NA HH Agency: NA        Social Determinants of Health (SDOH) Interventions     Readmission Risk Interventions No flowsheet data found.

## 2021-01-05 NOTE — Discharge Summary (Signed)
[facility] [place] [facility]   PATIENT NAME: Gabrielle Navarro    MR#:  [id number]  DATE OF BIRTH:  1927/09/17  DATE OF ADMISSION:  01/01/2021 ADMITTING PHYSICIAN: Alford Highland, MD  DATE OF DISCHARGE: 01/05/2021  PRIMARY CARE PHYSICIAN: Carren Rang, PA-C    ADMISSION DIAGNOSIS:  TIA (transient ischemic attack) [G45.9] Weakness [R53.1] Altered mental status, unspecified altered mental status type [R41.82] Knee pain [M25.569]  DISCHARGE DIAGNOSIS:  1.  Pseudogout with acute right knee pain and right first toe pain 2.  Weakness in the inability to walk 3.  Acute kidney injury with dehydration 4.  Essential hypertension 5.  Chronic atrial fibrillation 6.  Hypothyroidism unspecified 7.  Dementia without behavioral disturbance 8.  Anxiety depression 9.  Impaired fasting glucose  SECONDARY DIAGNOSIS:   Past Medical History:  Diagnosis Date   Atrial fibrillation (HCC)    Dementia (HCC)    GERD (gastroesophageal reflux disease)     HOSPITAL COURSE:   1.  Pseudogout with acute right knee pain and acute right first toe pain.  I did give the patient a dose of colchicine and a dose of Naprosyn.  Dr. Joice Lofts came and did an aspiration of the right knee and injected steroid.  Aspiration came back with calcium pyrophosphate crystals consistent with pseudogout.  The patient had no further pain after aspiration.  If pain comes back can consider dosing colchicine but at this point without pain I will hold off. 2.  Weakness and the ability walk upon coming into the hospital.  MRI of the brain negative for acute events.  Physical therapy recommending rehab.  Patient will be discharged to rehab today. 3.  Acute kidney injury with dehydration.  Creatinine 0.87 and improved to 0.52 with IV fluid bolus. 4.  Essential hypertension.  Initially blood pressure medications were held thinking that this could be a stroke but stroke was ruled out.  Antihypertensive medications were  restarted and blood pressure better controlled at this point. 5.  Chronic atrial fibrillation.  Continue Eliquis for anticoagulation and Coreg for rate control. 6.  Hypothyroidism unspecified on levothyroxine 7.  Dementia without behavioral disturbance on Namenda 8.  Anxiety and depression on trazodone and Remeron at night 9.  Impaired fasting glucose.  Hemoglobin A1c 6.3.  Continue follow-up as outpatient  DISCHARGE CONDITIONS:   Satisfactory  CONSULTS OBTAINED:  Treatment Team:  Christena Flake, MD  DRUG ALLERGIES:   Allergies  Allergen Reactions   Amoxicillin Anaphylaxis    Patient has tolerated cefdinir within the last year.  See full allergy assessment progress note from 10/9   Shellfish Allergy Anaphylaxis   Chocolate Flavor Hives   Sulfa Antibiotics Other (See Comments)   Latex Rash    DISCHARGE MEDICATIONS:   Allergies as of 01/05/2021       Reactions   Amoxicillin Anaphylaxis   Patient has tolerated cefdinir within the last year.  See full allergy assessment progress note from 10/9   Shellfish Allergy Anaphylaxis   Chocolate Flavor Hives   Sulfa Antibiotics Other (See Comments)   Latex Rash        Medication List     TAKE these medications    acetaminophen 325 MG tablet Commonly known as: TYLENOL Take 2 tablets (650 mg total) by mouth every 6 (six) hours as needed for mild pain or moderate pain (or temp > 37.5 C (99.5 F)).   apixaban 2.5 MG Tabs tablet Commonly known as: ELIQUIS Take 2.5 mg by mouth 2 (  two) times daily.   carvedilol 6.25 MG tablet Commonly known as: COREG Take 6.25 mg by mouth 2 (two) times daily with a meal.   lactose free nutrition Liqd Take 237 mLs by mouth 3 (three) times daily between meals.   levothyroxine 25 MCG tablet Commonly known as: SYNTHROID Take 25 mcg by mouth daily before breakfast.   losartan 50 MG tablet Commonly known as: COZAAR Take 50 mg by mouth 2 (two) times a day.   memantine 28 MG Cp24 24 hr  capsule Commonly known as: NAMENDA XR Take 28 mg by mouth daily.   mirtazapine 7.5 MG tablet Commonly known as: REMERON Take 7.5 mg by mouth at bedtime.   omeprazole 10 MG capsule Commonly known as: PRILOSEC Take 10 mg by mouth daily.   senna-docusate 8.6-50 MG tablet Commonly known as: Senokot-S Take 1 tablet by mouth at bedtime as needed for mild constipation.   traZODone 100 MG tablet Commonly known as: DESYREL Take 100 mg by mouth at bedtime.         DISCHARGE INSTRUCTIONS:   Follow-up team at rehab 1 day  If you experience worsening of your admission symptoms, develop shortness of breath, life threatening emergency, suicidal or homicidal thoughts you must seek medical attention immediately by calling 911 or calling your MD immediately  if symptoms less severe.  You Must read complete instructions/literature along with all the possible adverse reactions/side effects for all the Medicines you take and that have been prescribed to you. Take any new Medicines after you have completely understood and accept all the possible adverse reactions/side effects.   Please note  You were cared for by a hospitalist during your hospital stay. If you have any questions about your discharge medications or the care you received while you were in the hospital after you are discharged, you can call the unit and asked to speak with the hospitalist on call if the hospitalist that took care of you is not available. Once you are discharged, your primary care physician will handle any further medical issues. Please note that NO REFILLS for any discharge medications will be authorized once you are discharged, as it is imperative that you return to your primary care physician (or establish a relationship with a primary care physician if you do not have one) for your aftercare needs so that they can reassess your need for medications and monitor your lab values.    Today   CHIEF COMPLAINT:   Chief  Complaint  Patient presents with   Atrial Fibrillation   Altered Mental Status   Weakness    HISTORY OF PRESENT ILLNESS:  Gabrielle Navarro  is a 87 y.o. female sent in with the inability to walk and weakness.   VITAL SIGNS:  Blood pressure (!) 145/89, pulse 86, temperature 98 F (36.7 C), temperature source Oral, resp. rate 16, height  (1.651 m), weight 52.2 kg, SpO2 95 %.  I/O:   Intake/Output Summary (Last 24 hours) at 01/05/2021 0756 Last data filed at 01/05/2021 0539 Gross per 24 hour  Intake 480 ml  Output 1100 ml  Net -620 ml    PHYSICAL EXAMINATION:  GENERAL:  87 y.o.-year-old patient lying in the bed with no acute distress.  EYES: No scleral icterus.  HEENT: Head atraumatic, normocephalic. Oropharynx and nasopharynx clear.  LUNGS: Normal breath sounds bilaterally, no wheezing, rales,rhonchi or crepitation. No use of accessory muscles of respiration.  CARDIOVASCULAR: S1, S2 irregularly irregular no murmurs, rubs, or gallops.  ABDOMEN: Soft,  non-tender, non-distended.  EXTREMITIES: No pedal edema.  No pain with palpating right first toe and moving it.  Patient able to bend her right knee on her own and no pain with knee flexing and extending today. NEUROLOGIC: Patient moves all extremities on her own this morning. PSYCHIATRIC: The patient is alert and answers questions appropriately.  Has difficulty hearing so need to make sure she understands her questions.Marland Kitchen  SKIN: No obvious rash, lesion, or ulcer.   DATA REVIEW:   CBC Recent Labs  Lab 01/03/21 0521  WBC 6.5  HGB 14.4  HCT 42.4  PLT 169    Chemistries  Recent Labs  Lab 01/01/21 0951 01/03/21 0521  NA 136 136  K 3.9 4.2  CL 101 105  CO2 26 23  GLUCOSE 178* 228*  BUN 40* 21  CREATININE 0.87 0.52  CALCIUM 9.6 9.4  AST 26  --   ALT 14  --   ALKPHOS 66  --   BILITOT 1.4*  --     Microbiology Results  Results for orders placed or performed during the hospital encounter of 01/01/21  Resp Panel by  RT-PCR (Flu A&B, Covid) Nasopharyngeal Swab     Status: None   Collection Time: 01/01/21  1:17 PM   Specimen: Nasopharyngeal Swab; Nasopharyngeal(NP) swabs in vial transport medium  Result Value Ref Range Status   SARS Coronavirus 2 by RT PCR NEGATIVE NEGATIVE Final    Comment: (NOTE) SARS-CoV-2 target nucleic acids are NOT DETECTED.  The SARS-CoV-2 RNA is generally detectable in upper respiratory specimens during the acute phase of infection. The lowest concentration of SARS-CoV-2 viral copies this assay can detect is 138 copies/mL. A negative result does not preclude SARS-Cov-2 infection and should not be used as the sole basis for treatment or other patient management decisions. A negative result may occur with  improper specimen collection/handling, submission of specimen other than nasopharyngeal swab, presence of viral mutation(s) within the areas targeted by this assay, and inadequate number of viral copies(<138 copies/mL). A negative result must be combined with clinical observations, patient history, and epidemiological information. The expected result is Negative.  Fact Sheet for Patients:  BloggerCourse.com  Fact Sheet for Healthcare Providers:  SeriousBroker.it  This test is no t yet approved or cleared by the [place] FDA and  has been authorized for detection and/or diagnosis of SARS-CoV-2 by FDA under an Emergency Use Authorization (EUA). This EUA will remain  in effect (meaning this test can be used) for the duration of the COVID-19 declaration under Section 564(b)(1) of the Act, 21 U.S.C.section 360bbb-3(b)(1), unless the authorization is terminated  or revoked sooner.       Influenza A by PCR NEGATIVE NEGATIVE Final   Influenza B by PCR NEGATIVE NEGATIVE Final    Comment: (NOTE) The Xpert Xpress SARS-CoV-2/FLU/RSV plus assay is intended as an aid in the diagnosis of influenza from Nasopharyngeal swab  specimens and should not be used as a sole basis for treatment. Nasal washings and aspirates are unacceptable for Xpert Xpress SARS-CoV-2/FLU/RSV testing.  Fact Sheet for Patients: BloggerCourse.com  Fact Sheet for Healthcare Providers: SeriousBroker.it  This test is not yet approved or cleared by the [place] FDA and has been authorized for detection and/or diagnosis of SARS-CoV-2 by FDA under an Emergency Use Authorization (EUA). This EUA will remain in effect (meaning this test can be used) for the duration of the COVID-19 declaration under Section 564(b)(1) of the Act, 21 U.S.C. section 360bbb-3(b)(1), unless the authorization is terminated  or revoked.  Performed at [facility], [address] [place], [place] [address]   Body fluid culture w Gram Stain     Status: None (Preliminary result)   Collection Time: 01/02/21  5:18 PM   Specimen: Body Fluid  Result Value Ref Range Status   Specimen Description FLUID SYNOVIAL RIGHT KNEE  Final   Special Requests SYRINGE  Final   Gram Stain   Final    MODERATE WBC PRESENT,BOTH PMN AND MONONUCLEAR NO ORGANISMS SEEN    Culture   Final    NO GROWTH 1 DAY Performed at [facility], [address], [place], [place] [address]    Report Status PENDING  Incomplete  Resp Panel by RT-PCR (Flu A&B, Covid) Nasopharyngeal Swab     Status: None   Collection Time: 01/04/21  2:58 PM   Specimen: Nasopharyngeal Swab; Nasopharyngeal(NP) swabs in vial transport medium  Result Value Ref Range Status   SARS Coronavirus 2 by RT PCR NEGATIVE NEGATIVE Final    Comment: (NOTE) SARS-CoV-2 target nucleic acids are NOT DETECTED.  The SARS-CoV-2 RNA is generally detectable in upper respiratory specimens during the acute phase of infection. The lowest concentration of SARS-CoV-2 viral copies this assay can detect is 138 copies/mL. A negative result does not preclude  SARS-Cov-2 infection and should not be used as the sole basis for treatment or other patient management decisions. A negative result may occur with  improper specimen collection/handling, submission of specimen other than nasopharyngeal swab, presence of viral mutation(s) within the areas targeted by this assay, and inadequate number of viral copies(<138 copies/mL). A negative result must be combined with clinical observations, patient history, and epidemiological information. The expected result is Negative.  Fact Sheet for Patients:  BloggerCourse.com  Fact Sheet for Healthcare Providers:  SeriousBroker.it  This test is no t yet approved or cleared by the [place] FDA and  has been authorized for detection and/or diagnosis of SARS-CoV-2 by FDA under an Emergency Use Authorization (EUA). This EUA will remain  in effect (meaning this test can be used) for the duration of the COVID-19 declaration under Section 564(b)(1) of the Act, 21 U.S.C.section 360bbb-3(b)(1), unless the authorization is terminated  or revoked sooner.       Influenza A by PCR NEGATIVE NEGATIVE Final   Influenza B by PCR NEGATIVE NEGATIVE Final    Comment: (NOTE) The Xpert Xpress SARS-CoV-2/FLU/RSV plus assay is intended as an aid in the diagnosis of influenza from Nasopharyngeal swab specimens and should not be used as a sole basis for treatment. Nasal washings and aspirates are unacceptable for Xpert Xpress SARS-CoV-2/FLU/RSV testing.  Fact Sheet for Patients: BloggerCourse.com  Fact Sheet for Healthcare Providers: SeriousBroker.it  This test is not yet approved or cleared by the [place] FDA and has been authorized for detection and/or diagnosis of SARS-CoV-2 by FDA under an Emergency Use Authorization (EUA). This EUA will remain in effect (meaning this test can be used) for the duration of  the COVID-19 declaration under Section 564(b)(1) of the Act, 21 U.S.C. section 360bbb-3(b)(1), unless the authorization is terminated or revoked.  Performed at [facility], [address] [place], [place] [address]      Management plans discussed with the patient, family and they are in agreement.  CODE STATUS:     Code Status Orders  (From admission, onward)           Start     Ordered   01/01/21 1410  Full code  Continuous        01/01/21 1410           Code Status History     Date Active Date Inactive Code Status Order ID Comments User Context   10/03/2020 1703 10/05/2020 1921 Full Code [id number]  Lovenia Kim, DO ED   09/07/2019 0111 09/08/2019 0154 Full Code [id number]  Rometta Emery, MD ED      Advance Directive Documentation    Flowsheet Row Most Recent Value  Type of Advance Directive Healthcare Power of Attorney  Pre-existing out of facility DNR order (yellow form or pink MOST form) --  "MOST" Form in Place? --       TOTAL TIME TAKING CARE OF THIS PATIENT: 31 minutes.    Alford Highland M.D on 01/05/2021 at 7:56 AM  [facility]  CC: Primary care physician; Carren Rang, PA-C

## 2021-01-05 NOTE — Care Management Important Message (Signed)
Important Message  Patient Details  Name: Gabrielle Navarro MRN: [id number] Date of Birth: 11/10/1927   Medicare Important Message Given:  Yes  I reviewed the Important Message from Medicare with her daughter, [name] by phone [phone]) and is in agreement with the discharge plan for today.  I asked if she would like to receive a copy and she replied yes. I sent via Secure e-mail to [email or web address] as directed and thanked her for her time.    Olegario Messier A Siara Gorder 01/05/2021, 9:59 AM

## 2021-01-05 NOTE — Progress Notes (Signed)
Pt d/c to facility via EMS. IV removed intact. VSS. Education complete. All belongings sent with pt.

## 2021-01-05 NOTE — Progress Notes (Signed)
Report called to [facility] and all questions answered.  AVS packet ready for EMS transport.

## 2021-01-06 LAB — BODY FLUID CULTURE W GRAM STAIN: Culture: NO GROWTH

## 2021-04-17 ENCOUNTER — Emergency Department
Admission: EM | Admit: 2021-04-17 | Discharge: 2021-04-18 | Disposition: A | Discharge status: Skilled Nursing Facility | Attending: Emergency Medicine | Admitting: Emergency Medicine

## 2021-04-17 ENCOUNTER — Emergency Department

## 2021-04-17 ENCOUNTER — Other Ambulatory Visit: Payer: Self-pay

## 2021-04-17 DIAGNOSIS — Z96652 Presence of left artificial knee joint: Secondary | ICD-10-CM | POA: Insufficient documentation

## 2021-04-17 DIAGNOSIS — F0390 Unspecified dementia without behavioral disturbance: Secondary | ICD-10-CM | POA: Insufficient documentation

## 2021-04-17 DIAGNOSIS — Z79899 Other long term (current) drug therapy: Secondary | ICD-10-CM | POA: Insufficient documentation

## 2021-04-17 DIAGNOSIS — I4891 Unspecified atrial fibrillation: Secondary | ICD-10-CM | POA: Insufficient documentation

## 2021-04-17 DIAGNOSIS — R2243 Localized swelling, mass and lump, lower limb, bilateral: Secondary | ICD-10-CM | POA: Insufficient documentation

## 2021-04-17 DIAGNOSIS — Z7901 Long term (current) use of anticoagulants: Secondary | ICD-10-CM | POA: Insufficient documentation

## 2021-04-17 DIAGNOSIS — R600 Localized edema: Secondary | ICD-10-CM

## 2021-04-17 DIAGNOSIS — M25562 Pain in left knee: Secondary | ICD-10-CM | POA: Insufficient documentation

## 2021-04-17 MED ORDER — ACETAMINOPHEN 500 MG PO TABS
1000.0000 mg | ORAL_TABLET | Freq: Once | ORAL | Status: AC
  Administered 2021-04-18: 1000 mg via ORAL
  Filled 2021-04-17: qty 2

## 2021-04-17 NOTE — ED Triage Notes (Addendum)
Daughter Gabrielle Navarro at bedside No falls reported per nursing home in last day. Pt oriented to self.  Pt reports leg pain and foot pain bilaterally and both feet are cold with poor perfusion. Moderate swelling on left knee and leg. Edema 1+ right leg and ankle and edema 2+ left leg but not pitting.  Per daughter, facility also mentioned pt slurring speech- no last known normal. On modified NIHSS pt had has mild right mouth drooping and daughter reports left eye seems swollen. Per daughter:  Hx: L Carotid surgery four years ago- TIA's improved  Larey Seat 10/22 -went to rehab and back to [facility] Slipped and fell in 02/2021 out of bed and evaluated by hospice but pt did not hit head, no LOC.

## 2021-04-17 NOTE — ED Triage Notes (Addendum)
Pt presents for leg swelling per [facility]. Facility called EMS and pt transported via [facility] to [facility]. Per [facility] called Hospice also since pt is a hospice and DNR pt. Pt denies pain and rested comfortably on EMS transport per staff.  Hx: dementia- oriented to baseline per EMS and [facility] staff.  Can stand/pivot with heavy assistance and uses wheelchair.  Hx: latex  and shellfish allergy Hx: AFIB Hx: TIA Daughter OTW- called by facility.

## 2021-04-18 ENCOUNTER — Emergency Department

## 2021-04-18 LAB — CBC WITH DIFFERENTIAL/PLATELET
Abs Immature Granulocytes: 0.02 10*3/uL (ref 0.00–0.07)
Basophils Absolute: 0 10*3/uL (ref 0.0–0.1)
Basophils Relative: 1 %
Eosinophils Absolute: 0.2 10*3/uL (ref 0.0–0.5)
Eosinophils Relative: 3 %
HCT: 42.6 % (ref 36.0–46.0)
Hemoglobin: 13.7 g/dL (ref 12.0–15.0)
Immature Granulocytes: 0 %
Lymphocytes Relative: 24 %
Lymphs Abs: 1.3 10*3/uL (ref 0.7–4.0)
MCH: 31.6 pg (ref 26.0–34.0)
MCHC: 32.2 g/dL (ref 30.0–36.0)
MCV: 98.2 fL (ref 80.0–100.0)
Monocytes Absolute: 0.6 10*3/uL (ref 0.1–1.0)
Monocytes Relative: 11 %
Neutro Abs: 3.4 10*3/uL (ref 1.7–7.7)
Neutrophils Relative %: 61 %
Platelets: 166 10*3/uL (ref 150–400)
RBC: 4.34 MIL/uL (ref 3.87–5.11)
RDW: 13.2 % (ref 11.5–15.5)
WBC: 5.5 10*3/uL (ref 4.0–10.5)
nRBC: 0 % (ref 0.0–0.2)

## 2021-04-18 LAB — BASIC METABOLIC PANEL
Anion gap: 6 (ref 5–15)
BUN: 28 mg/dL — ABNORMAL HIGH (ref 8–23)
CO2: 23 mmol/L (ref 22–32)
Calcium: 9.4 mg/dL (ref 8.9–10.3)
Chloride: 101 mmol/L (ref 98–111)
Creatinine, Ser: 0.58 mg/dL (ref 0.44–1.00)
GFR, Estimated: >60 mL/min (ref >60)
Glucose, Bld: 107 mg/dL — ABNORMAL HIGH (ref 70–99)
Potassium: 4.5 mmol/L (ref 3.5–5.1)
Sodium: 130 mmol/L — ABNORMAL LOW (ref 135–145)

## 2021-04-18 LAB — TROPONIN I (HIGH SENSITIVITY): Troponin I (High Sensitivity): 5 ng/L (ref <18)

## 2021-04-18 LAB — BRAIN NATRIURETIC PEPTIDE: B Natriuretic Peptide: 70 pg/mL (ref 0.0–100.0)

## 2021-04-18 MED ORDER — HYDROCODONE-ACETAMINOPHEN 5-325 MG PO TABS: 1.0000 | ORAL_TABLET | Freq: Four times a day (QID) | ORAL | 0 refills | Status: DC | PRN

## 2021-04-18 MED ORDER — DOCUSATE SODIUM 100 MG PO CAPS: 100.0000 mg | ORAL_CAPSULE | Freq: Two times a day (BID) | ORAL | 0 refills | Status: AC

## 2021-04-18 MED ORDER — FUROSEMIDE 40 MG PO TABS: 40.0000 mg | ORAL_TABLET | Freq: Once | ORAL | Status: DC

## 2021-04-18 MED ORDER — ONDANSETRON 4 MG PO TBDP: 4.0000 mg | ORAL_TABLET | Freq: Four times a day (QID) | ORAL | 0 refills | Status: DC | PRN

## 2021-04-18 MED ORDER — ONDANSETRON HCL 4 MG/2ML IJ SOLN
4.0000 mg | Freq: Once | INTRAMUSCULAR | Status: AC
Start: 2021-04-18 — End: 2021-04-18
  Administered 2021-04-18: 4 mg via INTRAVENOUS
  Filled 2021-04-18: qty 2

## 2021-04-18 MED ORDER — FENTANYL CITRATE PF 50 MCG/ML IJ SOSY
50.0000 ug | PREFILLED_SYRINGE | Freq: Once | INTRAMUSCULAR | Status: AC
Start: 2021-04-18 — End: 2021-04-18
  Administered 2021-04-18: 50 ug via INTRAVENOUS
  Filled 2021-04-18: qty 1

## 2021-04-18 MED ORDER — FUROSEMIDE 10 MG/ML IJ SOLN
20.0000 mg | Freq: Once | INTRAMUSCULAR | Status: AC
Start: 2021-04-18 — End: 2021-04-18
  Administered 2021-04-18: 20 mg via INTRAVENOUS
  Filled 2021-04-18: qty 4

## 2021-04-18 NOTE — ED Notes (Addendum)
Pt urinated in brief after removing purewick prior to EMS transfer approx 0510am- pt also had defecated in brief. Cleaned pt with wipes and replaced brief prior to EMS transport.

## 2021-04-18 NOTE — ED Notes (Addendum)
Daughter contacted [facility] to inform pt was being discharged back to facility. Pt will need to be transported via [facility] since wheelchair bound back to facility. Let daughter know would call [facility] when pt OTW back to facility. Contact number for facility: [phone] Daughter also requested that we inform them to have hospice nurse have pt checked Sunday- she informed them originally and will follow back up with them in am also.

## 2021-04-18 NOTE — ED Provider Notes (Signed)
[facility] Provider Note    Event Date/Time   First MD Initiated Contact with Patient 04/17/21 2301     (approximate)   History   Leg Swelling   HPI  Gabrielle Navarro is a 88 y.o. female with history of A. fib on Eliquis, hypothyroidism, dementia who presents to the emergency department from [facility] assisted living facility with complaints of bilateral leg swelling and left knee pain that started today.  History provided by patient and patient's daughter at bedside.  No new injury.  History of previous left knee replacement about 11 years ago.  No history of CHF.  Patient is unable to tell us if she is having chest pain or shortness of breath.  No known fevers.  She has mostly wheelchair/bedbound.  She is on hospice for failure to thrive, decreased oral intake.  Patient is a DNR.  History provided by patient and daughter.    Past Medical History:  Diagnosis Date   Atrial fibrillation (HCC)    Dementia ([place])    GERD (gastroesophageal reflux disease)     No past surgical history on file.  MEDICATIONS:  Prior to Admission medications   Medication Sig Start Date End Date Taking? Authorizing Provider  acetaminophen (TYLENOL) 325 MG tablet Take 2 tablets (650 mg total) by mouth every 6 (six) hours as needed for mild pain or moderate pain (or temp > 37.5 C (99.5 F)). 01/05/21   Loletha Grayer, MD  apixaban (ELIQUIS) 2.5 MG TABS tablet Take 2.5 mg by mouth 2 (two) times daily.  05/04/18   [provider]  carvedilol (COREG) 6.25 MG tablet Take 6.25 mg by mouth 2 (two) times daily with a meal.  01/01/18   [provider]  lactose free nutrition (BOOST) LIQD Take 237 mLs by mouth 3 (three) times daily between meals.    [provider]  levothyroxine (SYNTHROID, LEVOTHROID) 25 MCG tablet Take 25 mcg by mouth daily before breakfast.  02/09/16   [provider]  losartan (COZAAR) 50 MG tablet Take 50 mg by mouth 2 (two) times a  day. 07/02/18   [provider]  memantine (NAMENDA XR) 28 MG CP24 24 hr capsule Take 28 mg by mouth daily.    [provider]  mirtazapine (REMERON) 7.5 MG tablet Take 7.5 mg by mouth at bedtime.    [provider]  omeprazole (PRILOSEC) 10 MG capsule Take 10 mg by mouth daily.  04/07/18   [provider]  senna-docusate (SENOKOT-S) 8.6-50 MG tablet Take 1 tablet by mouth at bedtime as needed for mild constipation. 01/05/21   Loletha Grayer, MD  traZODone (DESYREL) 100 MG tablet Take 100 mg by mouth at bedtime.  12/11/17   [provider]  QUEtiapine (SEROQUEL) 25 MG tablet Take 25 mg by mouth every evening. 07/20/18 09/26/18  [provider]    Physical Exam   Triage Vital Signs: ED Triage Vitals [04/17/21 2320]  Enc Vitals Group     BP (!) 141/64     Pulse Rate 72     Resp 20     Temp 97.9 F (36.6 C)     Temp Source Oral     SpO2 99 %     Weight 114 lb (51.7 kg)     Height 5\' 5"  (1.651 m)     Head Circumference      Peak Flow      Pain Score      Pain Loc  Pain Edu?      Excl. in [place]?     Most recent vital signs: Vitals:   04/18/21 0400 04/18/21 0430  BP: (!) 148/72 (!) 144/61  Pulse: 63 (!) 56  Resp: 12 12  Temp:    SpO2: 99% 100%    CONSTITUTIONAL: Alert and oriented to person only.  Elderly.  In no distress. HEAD: Normocephalic, atraumatic EYES: Conjunctivae clear, pupils appear equal, sclera nonicteric ENT: normal nose; moist mucous membranes NECK: Supple, normal ROM CARD: Irregularly irregular and rate controlled; S1 and S2 appreciated; no murmurs, no clicks, no rubs, no gallops RESP: Normal chest excursion without splinting or tachypnea; breath sounds clear and equal bilaterally; no wheezes, no rhonchi, no rales, no hypoxia or respiratory distress, speaking full sentences ABD/GI: Normal bowel sounds; non-distended; soft, non-tender, no rebound, no guarding, no peritoneal signs BACK: The back appears  normal EXT: Patient has 2+ pitting edema in bilateral distal lower extremities worse in the left lower extremity than the right without redness, warmth, rash or other lesions.  No joint effusions noted.  She has surgical scar noted from previous left total knee arthroplasty.  She is tender to palpation diffusely over the left knee without joint effusion.  She is able to extend this joint fully but resist me when I try to passively flex this knee.  She does seem to have some left posterior knee and calf pain on exam.  Due to her pitting edema, it is difficult to palpate pulses but I am able to Doppler 2+ DP and PT pulses bilaterally.  Compartments are soft. SKIN: Normal color for age and race; warm; no rash on exposed skin NEURO: Moves all extremities equally, normal speech PSYCH: The patient's mood and manner are appropriate.   ED Results / Procedures / Treatments   LABS: (all labs ordered are listed, but only abnormal results are displayed) Labs Reviewed  BASIC METABOLIC PANEL - Abnormal; Notable for the following components:      Result Value   Sodium 130 (*)    Glucose, Bld 107 (*)    BUN 28 (*)    All other components within normal limits  CBC WITH DIFFERENTIAL/PLATELET  BRAIN NATRIURETIC PEPTIDE  TROPONIN I (HIGH SENSITIVITY)     EKG:  EKG Interpretation  Date/Time:  Sunday April 18 2021 00:46:16 EST Ventricular Rate:  73 PR Interval:    QRS Duration: 66 QT Interval:  382 QTC Calculation: 420 R Axis:   15 Text Interpretation: Atrial fibrillation Low voltage QRS Cannot rule out Anterior infarct , age undetermined Abnormal ECG When compared with ECG of 01-Jan-2021 09:41, PREVIOUS ECG IS PRESENT Confirmed by Pryor Curia [phone]) on 04/18/2021 2:18:17 AM         RADIOLOGY: My personal review and interpretation of imaging: Venous Doppler showed no DVT.  Chest x-ray shows no pulmonary edema.  Left knee x-ray shows no acute abnormality.  I have personally reviewed all  radiology reports.   DG Chest 2 View  Result Date: 04/18/2021 CLINICAL DATA:  Leg swelling EXAM: CHEST - 2 VIEW COMPARISON:  01/01/2021 FINDINGS: Mild cardiomegaly. No focal airspace consolidation or pulmonary edema. Normal pleural spaces. IMPRESSION: Mild cardiomegaly without pulmonary edema. Electronically Signed   By: Ulyses Jarred M.D.   On: 04/18/2021 00:02   US Venous Img Lower Bilateral  Result Date: 04/18/2021 CLINICAL DATA:  Leg swelling EXAM: BILATERAL LOWER EXTREMITY VENOUS DOPPLER ULTRASOUND TECHNIQUE: Gray-scale sonography with compression, as well as color and duplex ultrasound, were performed to evaluate  the deep venous system(s) from the level of the common femoral vein through the popliteal and proximal calf veins. COMPARISON:  None. FINDINGS: VENOUS Normal compressibility of the common femoral, superficial femoral, and popliteal veins, as well as the visualized calf veins. Visualized portions of profunda femoral vein and great saphenous vein unremarkable. No filling defects to suggest DVT on grayscale or color Doppler imaging. Doppler waveforms show normal direction of venous flow, normal respiratory plasticity and response to augmentation. OTHER None. Limitations: none IMPRESSION: Negative. Electronically Signed   By: Julian Hy M.D.   On: 04/18/2021 00:46   DG Knee Left Port  Result Date: 04/18/2021 CLINICAL DATA:  Left knee pain, no known injury, initial encounter EXAM: PORTABLE LEFT KNEE - 4 VIEW COMPARISON:  None. FINDINGS: Left knee prosthesis is noted. No acute fracture or dislocation is noted. No joint effusion is seen. No acute soft tissue changes are seen. IMPRESSION: No acute abnormality noted. Electronically Signed   By: Inez Catalina M.D.   On: 04/18/2021 01:05     PROCEDURES:  Critical Care performed: No    .1-3 Lead EKG Interpretation Performed by: Yacine Garriga, Delice Bison, DO Authorized by: Lonisha Bobby, Delice Bison, DO     Interpretation: normal     ECG rate:  65   ECG  rate assessment: normal     Rhythm: atrial fibrillation     Ectopy: none     Conduction: normal      IMPRESSION / MDM / ASSESSMENT AND PLAN / ED COURSE  I reviewed the triage vital signs and the nursing notes.    Patient here complaints of bilateral lower extremity pain with swelling worse on the left than the right side with no history of trauma.  The patient is on the cardiac monitor to evaluate for evidence of arrhythmia and/or significant heart rate changes.   DIFFERENTIAL DIAGNOSIS (includes but not limited to):   Lymphedema, fracture, DVT, CHF.  No signs of cellulitis, septic arthritis, gout, compartment syndrome on exam.  She has strong equal pulses bilaterally.  Doubt arterial obstruction.   PLAN: Cardiac labs, EKG, chest x-ray, left knee x-ray, bilateral venous Dopplers, Tylenol for pain control.   MEDICATIONS GIVEN IN ED: Medications  acetaminophen (TYLENOL) tablet 1,000 mg (1,000 mg Oral Given 04/18/21 0100)  furosemide (LASIX) injection 20 mg (20 mg Intravenous Given 04/18/21 0203)  fentaNYL (SUBLIMAZE) injection 50 mcg (50 mcg Intravenous Given 04/18/21 0247)  ondansetron (ZOFRAN) injection 4 mg (4 mg Intravenous Given 04/18/21 0246)     ED COURSE: Patient's labs are reassuring.  Normal troponin.  Normal BNP.  No leukocytosis.  Chest x-ray reviewed by myself and radiology shows no infiltrate, edema or pneumothorax.  EKG shows atrial fibrillation that is rate controlled with no ischemic abnormality.  Left knee x-ray reviewed by myself and radiologist shows sequela of a left total knee arthroplasty but no acute abnormality.  Venous Dopplers reviewed by myself and radiology shows no DVT.  No significant improvement in pain after Tylenol.  Daughter reports that she is mostly bedbound, wheelchair-bound but she does stand to transfer and daughter is worried that due to her knee pain she will not be able to do this at the nursing home.  She is in assisted living facility.  Will give  fentanyl and reassess.      Patient's daughter reports pain has improved after fentanyl.  We have placed her in a knee sleeve for comfort as well.  Recommended compression stockings to help with her leg swelling.  She has been given a dose of Lasix here and has been diuresing.  I do not feel she needs a prescription for Lasix as we discussed concerned that this could cause electrolyte derangements and dehydration especially given her history of failure to thrive and decreased oral intake.  Will discharge with prescription of pain medication.  Daughter is comfortable with her going back to her assisted living facility.  We did discuss the possibility of her being upgraded to a skilled nursing facility which is something that the social worker with hospice may be able to assist with.  Will discharge back to nursing facility via EMS.   At this time, I do not feel there is any life-threatening condition present. I reviewed all nursing notes, vitals, pertinent previous records.  All lab and urine results, EKGs, imaging ordered have been independently reviewed and interpreted by myself.  I reviewed all available radiology reports from any imaging ordered this visit.  Based on my assessment, I feel the patient is safe to be discharged home without further emergent workup and can continue workup as an outpatient as needed. Discussed all findings, treatment plan as well as usual and customary return precautions with patient and daughter.  They verbalize understanding and are comfortable with this plan.  Outpatient follow-up has been provided as needed.  All questions have been answered.  CONSULTS: No admission needed at this time given reassuring cardiac work-up.   OUTSIDE RECORDS REVIEWED: Reviewed most recent echocardiogram on 01/02/2021 which showed EF of 60 to 65% with no wall motion abnormalities.  IMPRESSIONS     1. Left ventricular ejection fraction, by estimation, is 60 to 65%. The  left ventricle  has normal function. The left ventricle has no regional  wall motion abnormalities. Left ventricular diastolic parameters are  indeterminate.   2. Right ventricular systolic function is normal. The right ventricular  size is normal. There is moderately elevated pulmonary artery systolic  pressure. The estimated right ventricular systolic pressure is 123XX123 mmHg.   3. Left atrial size was moderately dilated.   4. The mitral valve is normal in structure. Moderate mitral valve  regurgitation. No evidence of mitral stenosis.   5. Tricuspid valve regurgitation is moderate.        FINAL CLINICAL IMPRESSION(S) / ED DIAGNOSES   Final diagnoses:  Bilateral lower extremity edema  Acute pain of left knee     Rx / DC Orders   ED Discharge Orders          Ordered    HYDROcodone-acetaminophen (NORCO/VICODIN) 5-325 MG tablet  Every 6 hours PRN        04/18/21 0344    docusate sodium (COLACE) 100 MG capsule  2 times daily        04/18/21 0344    ondansetron (ZOFRAN-ODT) 4 MG disintegrating tablet  Every 6 hours PRN        04/18/21 0344             Note:  This document was prepared using Dragon voice recognition software and may include unintentional dictation errors.   Taveon Enyeart, Delice Bison, DO 04/18/21 [id number]

## 2021-04-18 NOTE — ED Notes (Signed)
Discharges provided to daughter prior to daughter leaving once pt was declared clear for discharge back to facility.

## 2021-04-18 NOTE — ED Notes (Signed)
When patient sleeping after Fentanyl she started to desaturate into low 90's/high 80's. Applied nasal oxygen of 2L/min via nasal cannula for oxygen support while sleeping. Patient SPO2 improved to 95% or greater.

## 2021-04-18 NOTE — ED Notes (Signed)
Prior to admin Fentanyl, pt was asleep and could not do pain re-assessment. Per daughter, pt had recently moved left leg and vocalized due to severe pain in left knee. Informed EDP.

## 2021-04-18 NOTE — ED Notes (Signed)
U/s at bedside

## 2021-04-18 NOTE — ED Notes (Signed)
[facility] to transport pt to [facility]/ [address]

## 2021-04-18 NOTE — Discharge Instructions (Addendum)
I recommend wearing compression stockings to help with leg swelling.  Also elevating your legs with a pillow while at rest can help with swelling as well.  Your cardiac work up today was reassuring.  Xray of your left knee showed no abnormality.  You are being provided a prescription for opiates (also known as narcotics) for pain control.  Opiates can be addictive and should only be used when absolutely necessary for pain control when other alternatives do not work.  We recommend you only use them for the recommended amount of time and only as prescribed.  Please do not take with other sedative medications or alcohol.  Please do not drive, operate machinery, make important decisions while taking opiates.  Please note that these medications can be addictive and have high abuse potential.  Patients can become addicted to narcotics after only taking them for a few days.  Please keep these medications locked away from children, teenagers or any family members with history of substance abuse.  Narcotic pain medicine may also make you constipated.  You may use over-the-counter medications such as MiraLAX, Colace to prevent constipation.  If you become constipated, you may use over-the-counter enemas as needed.  Itching and nausea are also common side effects of narcotic pain medication.  If you develop uncontrolled vomiting or a rash, please stop these medications and seek medical care.

## 2021-09-15 ENCOUNTER — Emergency Department

## 2021-09-15 ENCOUNTER — Inpatient Hospital Stay: Admit: 2021-09-15

## 2021-09-15 ENCOUNTER — Encounter: Payer: Self-pay | Admitting: Internal Medicine

## 2021-09-15 ENCOUNTER — Inpatient Hospital Stay
Admission: EM | Admit: 2021-09-15 | Discharge: 2021-09-22 | DRG: 315 | Disposition: A | Discharge status: Skilled Nursing Facility | Source: Skilled Nursing Facility | Attending: Internal Medicine | Admitting: Internal Medicine

## 2021-09-15 ENCOUNTER — Other Ambulatory Visit: Payer: Self-pay

## 2021-09-15 ENCOUNTER — Inpatient Hospital Stay

## 2021-09-15 DIAGNOSIS — G4700 Insomnia, unspecified: Secondary | ICD-10-CM | POA: Diagnosis present

## 2021-09-15 DIAGNOSIS — I10 Essential (primary) hypertension: Secondary | ICD-10-CM | POA: Diagnosis present

## 2021-09-15 DIAGNOSIS — R519 Headache, unspecified: Secondary | ICD-10-CM | POA: Diagnosis present

## 2021-09-15 DIAGNOSIS — Z91013 Allergy to seafood: Secondary | ICD-10-CM | POA: Diagnosis not present

## 2021-09-15 DIAGNOSIS — I959 Hypotension, unspecified: Secondary | ICD-10-CM | POA: Diagnosis present

## 2021-09-15 DIAGNOSIS — Z882 Allergy status to sulfonamides status: Secondary | ICD-10-CM

## 2021-09-15 DIAGNOSIS — W050XXA Fall from non-moving wheelchair, initial encounter: Secondary | ICD-10-CM | POA: Diagnosis present

## 2021-09-15 DIAGNOSIS — M47812 Spondylosis without myelopathy or radiculopathy, cervical region: Secondary | ICD-10-CM | POA: Diagnosis present

## 2021-09-15 DIAGNOSIS — Z66 Do not resuscitate: Secondary | ICD-10-CM | POA: Diagnosis present

## 2021-09-15 DIAGNOSIS — H5702 Anisocoria: Secondary | ICD-10-CM

## 2021-09-15 DIAGNOSIS — E8809 Other disorders of plasma-protein metabolism, not elsewhere classified: Secondary | ICD-10-CM | POA: Diagnosis present

## 2021-09-15 DIAGNOSIS — Z79890 Hormone replacement therapy (postmenopausal): Secondary | ICD-10-CM

## 2021-09-15 DIAGNOSIS — Z881 Allergy status to other antibiotic agents status: Secondary | ICD-10-CM

## 2021-09-15 DIAGNOSIS — M25562 Pain in left knee: Secondary | ICD-10-CM | POA: Diagnosis present

## 2021-09-15 DIAGNOSIS — E861 Hypovolemia: Secondary | ICD-10-CM

## 2021-09-15 DIAGNOSIS — G309 Alzheimer's disease, unspecified: Secondary | ICD-10-CM | POA: Diagnosis present

## 2021-09-15 DIAGNOSIS — K219 Gastro-esophageal reflux disease without esophagitis: Secondary | ICD-10-CM | POA: Diagnosis present

## 2021-09-15 DIAGNOSIS — I9589 Other hypotension: Secondary | ICD-10-CM | POA: Diagnosis not present

## 2021-09-15 DIAGNOSIS — Z79899 Other long term (current) drug therapy: Secondary | ICD-10-CM | POA: Diagnosis not present

## 2021-09-15 DIAGNOSIS — Z6823 Body mass index (BMI) 23.0-23.9, adult: Secondary | ICD-10-CM

## 2021-09-15 DIAGNOSIS — F0280 Dementia in other diseases classified elsewhere without behavioral disturbance: Secondary | ICD-10-CM | POA: Diagnosis present

## 2021-09-15 DIAGNOSIS — Z91040 Latex allergy status: Secondary | ICD-10-CM

## 2021-09-15 DIAGNOSIS — F0390 Unspecified dementia without behavioral disturbance: Secondary | ICD-10-CM | POA: Diagnosis present

## 2021-09-15 DIAGNOSIS — H9190 Unspecified hearing loss, unspecified ear: Secondary | ICD-10-CM | POA: Diagnosis present

## 2021-09-15 DIAGNOSIS — E440 Moderate protein-calorie malnutrition: Secondary | ICD-10-CM | POA: Diagnosis present

## 2021-09-15 DIAGNOSIS — E43 Unspecified severe protein-calorie malnutrition: Secondary | ICD-10-CM | POA: Diagnosis not present

## 2021-09-15 DIAGNOSIS — R509 Fever, unspecified: Secondary | ICD-10-CM | POA: Diagnosis not present

## 2021-09-15 DIAGNOSIS — E039 Hypothyroidism, unspecified: Secondary | ICD-10-CM | POA: Diagnosis present

## 2021-09-15 DIAGNOSIS — R4182 Altered mental status, unspecified: Principal | ICD-10-CM | POA: Diagnosis present

## 2021-09-15 DIAGNOSIS — R531 Weakness: Secondary | ICD-10-CM

## 2021-09-15 DIAGNOSIS — Z20822 Contact with and (suspected) exposure to covid-19: Secondary | ICD-10-CM | POA: Diagnosis present

## 2021-09-15 DIAGNOSIS — R600 Localized edema: Secondary | ICD-10-CM | POA: Diagnosis present

## 2021-09-15 DIAGNOSIS — I482 Chronic atrial fibrillation, unspecified: Secondary | ICD-10-CM | POA: Diagnosis present

## 2021-09-15 DIAGNOSIS — Z7901 Long term (current) use of anticoagulants: Secondary | ICD-10-CM | POA: Diagnosis not present

## 2021-09-15 DIAGNOSIS — Z91018 Allergy to other foods: Secondary | ICD-10-CM | POA: Diagnosis not present

## 2021-09-15 DIAGNOSIS — Z8673 Personal history of transient ischemic attack (TIA), and cerebral infarction without residual deficits: Secondary | ICD-10-CM

## 2021-09-15 LAB — CBC WITH DIFFERENTIAL/PLATELET
Abs Immature Granulocytes: 0.03 10*3/uL (ref 0.00–0.07)
Basophils Absolute: 0 10*3/uL (ref 0.0–0.1)
Basophils Relative: 0 %
Eosinophils Absolute: 0.1 10*3/uL (ref 0.0–0.5)
Eosinophils Relative: 2 %
HCT: 41.4 % (ref 36.0–46.0)
Hemoglobin: 13.2 g/dL (ref 12.0–15.0)
Immature Granulocytes: 0 %
Lymphocytes Relative: 12 %
Lymphs Abs: 0.9 10*3/uL (ref 0.7–4.0)
MCH: 29.9 pg (ref 26.0–34.0)
MCHC: 31.9 g/dL (ref 30.0–36.0)
MCV: 93.7 fL (ref 80.0–100.0)
Monocytes Absolute: 0.6 10*3/uL (ref 0.1–1.0)
Monocytes Relative: 8 %
Neutro Abs: 5.8 10*3/uL (ref 1.7–7.7)
Neutrophils Relative %: 78 %
Platelets: 214 10*3/uL (ref 150–400)
RBC: 4.42 MIL/uL (ref 3.87–5.11)
RDW: 12.9 % (ref 11.5–15.5)
WBC: 7.5 10*3/uL (ref 4.0–10.5)
nRBC: 0 % (ref 0.0–0.2)

## 2021-09-15 LAB — COMPREHENSIVE METABOLIC PANEL
ALT: 13 U/L (ref 0–44)
AST: 27 U/L (ref 15–41)
Albumin: 3.4 g/dL — ABNORMAL LOW (ref 3.5–5.0)
Alkaline Phosphatase: 65 U/L (ref 38–126)
Anion gap: 9 (ref 5–15)
BUN: 25 mg/dL — ABNORMAL HIGH (ref 8–23)
CO2: 25 mmol/L (ref 22–32)
Calcium: 9.6 mg/dL (ref 8.9–10.3)
Chloride: 105 mmol/L (ref 98–111)
Creatinine, Ser: 0.88 mg/dL (ref 0.44–1.00)
GFR, Estimated: >60 mL/min (ref >60)
Glucose, Bld: 135 mg/dL — ABNORMAL HIGH (ref 70–99)
Potassium: 3.9 mmol/L (ref 3.5–5.1)
Sodium: 139 mmol/L (ref 135–145)
Total Bilirubin: 0.7 mg/dL (ref 0.3–1.2)
Total Protein: 6.2 g/dL — ABNORMAL LOW (ref 6.5–8.1)

## 2021-09-15 LAB — URINALYSIS, ROUTINE W REFLEX MICROSCOPIC
Bacteria, UA: NONE SEEN
Bilirubin Urine: NEGATIVE
Glucose, UA: NEGATIVE mg/dL
Hgb urine dipstick: NEGATIVE
Ketones, ur: NEGATIVE mg/dL
Leukocytes,Ua: NEGATIVE
Nitrite: NEGATIVE
Protein, ur: 30 mg/dL — AB
Specific Gravity, Urine: 1.018 (ref 1.005–1.030)
pH: 7 (ref 5.0–8.0)

## 2021-09-15 LAB — TROPONIN I (HIGH SENSITIVITY): Troponin I (High Sensitivity): 8 ng/L (ref <18)

## 2021-09-15 LAB — TSH: TSH: 4.77 u[IU]/mL — ABNORMAL HIGH (ref 0.350–4.500)

## 2021-09-15 LAB — PROCALCITONIN: Procalcitonin: <0.1 ng/mL

## 2021-09-15 LAB — AMMONIA: Ammonia: 16 umol/L (ref 9–35)

## 2021-09-15 LAB — SARS CORONAVIRUS 2 BY RT PCR: SARS Coronavirus 2 by RT PCR: NEGATIVE

## 2021-09-15 LAB — VITAMIN B12: Vitamin B-12: 780 pg/mL (ref 180–914)

## 2021-09-15 LAB — BRAIN NATRIURETIC PEPTIDE: B Natriuretic Peptide: 212.7 pg/mL — ABNORMAL HIGH (ref 0.0–100.0)

## 2021-09-15 MED ORDER — MIRTAZAPINE 15 MG PO TABS
7.5000 mg | ORAL_TABLET | Freq: Every day | ORAL | Status: DC
  Administered 2021-09-15 – 2021-09-21 (×7): 7.5 mg via ORAL
  Filled 2021-09-15 (×7): qty 1

## 2021-09-15 MED ORDER — LACTATED RINGERS IV BOLUS
1000.0000 mL | Freq: Once | INTRAVENOUS | Status: AC
  Administered 2021-09-15: 1000 mL via INTRAVENOUS

## 2021-09-15 MED ORDER — ACETAMINOPHEN 650 MG RE SUPP: 650.0000 mg | Freq: Four times a day (QID) | RECTAL | Status: AC | PRN

## 2021-09-15 MED ORDER — BOOST PO LIQD
237.0000 mL | Freq: Three times a day (TID) | ORAL | Status: DC
  Administered 2021-09-15: 237 mL via ORAL
  Filled 2021-09-15: qty 237

## 2021-09-15 MED ORDER — NOREPINEPHRINE 4 MG/250ML-% IV SOLN: 0.0000 ug/min | INTRAVENOUS | Status: DC

## 2021-09-15 MED ORDER — MEMANTINE HCL ER 28 MG PO CP24
28.0000 mg | ORAL_CAPSULE | Freq: Every day | ORAL | Status: DC
  Administered 2021-09-16 – 2021-09-22 (×7): 28 mg via ORAL
  Filled 2021-09-15 (×7): qty 1

## 2021-09-15 MED ORDER — MIRTAZAPINE 15 MG PO TABS: 7.5000 mg | ORAL_TABLET | Freq: Every day | ORAL | Status: DC

## 2021-09-15 MED ORDER — NOREPINEPHRINE 4 MG/250ML-% IV SOLN
2.0000 ug/min | INTRAVENOUS | Status: DC
  Administered 2021-09-15: 2 ug/min via INTRAVENOUS
  Filled 2021-09-15 (×2): qty 250

## 2021-09-15 MED ORDER — ACETAMINOPHEN 325 MG PO TABS
650.0000 mg | ORAL_TABLET | Freq: Four times a day (QID) | ORAL | Status: AC | PRN
  Administered 2021-09-15 – 2021-09-18 (×3): 650 mg via ORAL
  Filled 2021-09-15 (×3): qty 2

## 2021-09-15 MED ORDER — LEVOTHYROXINE SODIUM 25 MCG PO TABS
25.0000 ug | ORAL_TABLET | Freq: Every day | ORAL | Status: DC
  Administered 2021-09-16 – 2021-09-22 (×7): 25 ug via ORAL
  Filled 2021-09-15 (×6): qty 1
  Filled 2021-09-15: qty 0.5

## 2021-09-15 MED ORDER — MIDODRINE HCL 5 MG PO TABS
10.0000 mg | ORAL_TABLET | Freq: Once | ORAL | Status: AC | PRN
  Administered 2021-09-15: 10 mg via ORAL
  Filled 2021-09-15: qty 2

## 2021-09-15 MED ORDER — TRAZODONE HCL 50 MG PO TABS
50.0000 mg | ORAL_TABLET | Freq: Every day | ORAL | Status: DC
Start: 2021-09-15 — End: 2021-09-22
  Administered 2021-09-15 – 2021-09-21 (×7): 50 mg via ORAL
  Filled 2021-09-15 (×7): qty 1

## 2021-09-15 MED ORDER — ONDANSETRON HCL 4 MG PO TABS: 4.0000 mg | ORAL_TABLET | Freq: Four times a day (QID) | ORAL | Status: DC | PRN

## 2021-09-15 MED ORDER — SODIUM CHLORIDE 0.9 % IV SOLN: 250.0000 mL | INTRAVENOUS | Status: DC

## 2021-09-15 MED ORDER — DOCUSATE SODIUM 100 MG PO CAPS: 100.0000 mg | ORAL_CAPSULE | Freq: Two times a day (BID) | ORAL | Status: DC | PRN

## 2021-09-15 MED ORDER — TRAZODONE HCL 50 MG PO TABS
50.0000 mg | ORAL_TABLET | Freq: Every evening | ORAL | Status: DC | PRN
Start: 2021-09-15 — End: 2021-09-22
  Administered 2021-09-18: 50 mg via ORAL
  Filled 2021-09-15: qty 1

## 2021-09-15 MED ORDER — ACETAMINOPHEN 500 MG PO TABS
1000.0000 mg | ORAL_TABLET | Freq: Once | ORAL | Status: AC
  Administered 2021-09-15: 1000 mg via ORAL
  Filled 2021-09-15: qty 2

## 2021-09-15 MED ORDER — SENNOSIDES-DOCUSATE SODIUM 8.6-50 MG PO TABS: 1.0000 | ORAL_TABLET | Freq: Every evening | ORAL | Status: DC | PRN

## 2021-09-15 MED ORDER — APIXABAN 2.5 MG PO TABS
2.5000 mg | ORAL_TABLET | Freq: Two times a day (BID) | ORAL | Status: DC
  Administered 2021-09-15 – 2021-09-22 (×14): 2.5 mg via ORAL
  Filled 2021-09-15 (×14): qty 1

## 2021-09-15 MED ORDER — ONDANSETRON HCL 4 MG/2ML IJ SOLN: 4.0000 mg | Freq: Four times a day (QID) | INTRAMUSCULAR | Status: DC | PRN

## 2021-09-15 NOTE — Assessment & Plan Note (Addendum)
Patient was on carvedilol, losartan and furosemide at home. Currently on Levophed due to persistent hypotension after getting 1 L of fluid. -Start her on midodrine -Try weaning from Levophed

## 2021-09-15 NOTE — ED Triage Notes (Signed)
Pt arrived from ALF [facility] d/t AMS and fall last night. Pt has uneven pupil sizes and c/o h/a

## 2021-09-15 NOTE — Progress Notes (Signed)
An USGPIV (ultrasound guided PIV) has been placed for short-term vasopressor infusion. A correctly placed ivWatch must be used when administering Vasopressors. Should this treatment be needed beyond 72 hours, central line access should be obtained.  It will be the responsibility of the bedside nurse to follow best practice to prevent extravasations.   ?

## 2021-09-15 NOTE — Assessment & Plan Note (Addendum)
Etiology remains unclear, no obvious or significant lab abnormalities. Currently at her baseline mental status. She was able to communicate and recognize her daughter. Might be due to hypotension causing some brain anoxia and it improves with improvement in blood pressure with vasopressors. Blood and urine cultures pending. -Continue to monitor

## 2021-09-15 NOTE — Assessment & Plan Note (Signed)
Baseline on left is 4-5 mm and do not dilate Right is regular shape and size and does dilate with light

## 2021-09-15 NOTE — Assessment & Plan Note (Signed)
-   Resumed home Eliquis 2.5 mg p.o. twice daily - Holding home carvedilol 6.25 mg p.o. twice daily at this time due to hypotension on presentation

## 2021-09-15 NOTE — ED Notes (Signed)
Holding Levo due to map of 89

## 2021-09-15 NOTE — Assessment & Plan Note (Addendum)
-   Home carvedilol 6.25 mg p.o. twice daily, losartan 25 mg daily, furosemide 20 mg daily have been held at this time due to hypotension on presentation - Per daughter at bedside patient has taken these medications via facility prior to ED presentation -Continue to hold 

## 2021-09-15 NOTE — Hospital Course (Addendum)
Mrs. Prudence Heiny is a 88 year old female with history of hypothyroid, hypertension, insomnia, atrial fibrillation on Eliquis, chronic left PCA infarct, chronic right cerebellar infarct, cervical spondylosis, who presents emergency department from [facility] facility for chief concerns of altered mental status and falls.  Initial vitals in the emergency department showed temperature 97.3, respiration rate of 18, heart rate of 71, blood pressure 103/60, SPO2 99% on room air.  Serum sodium is 139, potassium 3.9, chloride 105, bicarb 25, BUN of 25, serum creatinine of 0.88, GFR greater than 60, nonfasting blood glucose 135, WBC 7.5, hemoglobin 13.2, platelets of 214.  WBC 7.5, hemoglobin 13.2, platelets of 214.  High sensitive troponin is 8. UA was negative for leukocytes and nitrates  ED treatment: Tylenol 1 g, LR 1 L bolus.  Patient with history of advanced dementia and at baseline recognizes daughter what she was doing in the ED.  CT head and MRI brain was without any acute abnormality. Other images which include chest x-ray, DG knee and hip was also without any acute abnormality or fractures. CT spine was negative for any acute abnormality. Per daughter patient has decreased p.o. intake for many months and gradually declining.  Per chart review patient is active with hospice services with [facility].   6/29: UA does not look infected.  Blood cultures pending.  No other obvious abnormality noted on labs except mildly elevated TSH at 4.7.  BNP at 212.  COVID PCR negative. echocardiogram with normal EF and no diastolic dysfunction.  Mildly dilated right and left atrium. Patient was placed on pressors overnight due to persistent hypotension.  We will try midodrine and try weaning her off from Levophed.  6/30: 1/4 blood cultures blood cultures with Staphylococcus species, pending further identification and susceptibility.  Patient remained afebrile and no leukocytosis, based on her persistent  hypotension requiring pressor we decided to start vancomycin.  Blood pressure improved overnight and she was able to weaned off from Levophed .Repeat blood cultures ordered. Patient was on multiple medications which can lower blood pressure.  No other sign of infection. Checking a.m. cortisol.  Procalcitonin is negative. If blood cultures remain negative by tomorrow we will discontinue vancomycin.  7/1: Repeat blood cultures remain negative.  Patient had low-grade fever overnight.  Otherwise at baseline.  Apparently cannot return to her facility until they will evaluate her for eligibility. Her facility cannot evaluate her until Wednesday.  7/2: Remained stable and afebrile.  First blood culture 1/4 bottles with staph Cohnii, not much significant and she received 2 days of vancomycin.  We will give her 3 more days of Keflex as she did had 1 episode of fever.  Waiting for facility evaluation so she can go back to her assisted living.  7/3: Patient remained stable.  [facility] will not be able to accept her today due to staffing issues.  Most likely can go tomorrow.  7/4: Patient remained stable.  [facility] will not take her until Thursday.  7/5: Patient remained stable.  Completed the antibiotics.  [facility] offered bed for today where she is being discharged for further rehab.  Patient will continue with current medications and follow-up with her providers.

## 2021-09-15 NOTE — Progress Notes (Signed)
[facility] ED 07 [facility])       This patient is a current hospice patient with [facility], admitted with a terminal diagnosis: abnormal weight loss  RELATED DIAGNOSES: mixed Alzheimer's/vascular dementia, HTN, HLD, AFIB, TIA's, anxiety, depression, NIDDMII, GERD, recurrent UTI, pseudo-gout, history of R facial cellulitis, anorexia, insomnia, AFT   [facility] will continue to follow for any discharge planning needs and to coordinate continuation of hospice care.     Please call with any questions/concerns.    Thank you for the opportunity to participate in this patient's care.   Odette Fraction, MSW [facility] Liaison  [phone]

## 2021-09-15 NOTE — ED Provider Notes (Signed)
[facility] Provider Note    Event Date/Time   First MD Initiated Contact with Patient 09/15/21 1041     (approximate)   History   Altered Mental Status (Pt arrived from [facility]F [facility] d/t AMS and fall last night. Pt has uneven pupil sizes and c/o h/a)   HPI  Gabrielle Navarro is a 88 y.o. female who presents to the ED for evaluation of Altered Mental Status (Pt arrived from [facility] d/t AMS and fall last night. Pt has uneven pupil sizes and c/o h/a)   I reviewed telemedicine visit from 2/17.  History of dementia, mixed vascular and Alzheimer's, A-fib on Eliquis, DNR.  Patient presents to the ED by EMS from her SNF for evaluation of altered mentation this morning and a fall last night.  Patient is pleasantly disoriented and unable to provide any relevant history.  She reports a headache and left knee pain.  Daughter reports majority of history when she later arrives at the bedside.  She reports that she last saw the patient this past Friday, about 5 days ago and she seemed fine then.  She is typically conversational and pretty sharp.  Often does not know what day it is, but is very interactive.  Sings to their grandchildren.  Physical Exam   Triage Vital Signs: ED Triage Vitals  Enc Vitals Group     BP      Pulse      Resp      Temp      Temp src      SpO2      Weight      Height      Head Circumference      Peak Flow      Pain Score      Pain Loc      Pain Edu?      Excl. in GC?     Most recent vital signs: Vitals:   09/15/21 1215 09/15/21 1230  BP: (!) 96/48 (!) 87/47  Pulse: 66 63  Resp: 19 12  Temp:    SpO2: 99% 100%    General: Somewhat lethargic, but awakens to vocal stimulation and keeps her eyes open for conversation.  Pleasantly disoriented.  Speech is mildly dysarthric. CV:  Good peripheral perfusion. RRR Resp:  Normal effort.  Abd:  No distention.  Soft and benign MSK:  No deformity noted.  No swelling, deformity or  signs of trauma.  She has pain to her left knee and hip when ranging her left lower extremity but ROM is intact. Neuro:  Mild dysarthria noted to her speech, it is somewhat slurred, but no other cranial nerve deficits appreciated.  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  Full strength and sensation to all 4 extremities.  No laterality Other:     ED Results / Procedures / Treatments   Labs (all labs ordered are listed, but only abnormal results are displayed) Labs Reviewed  COMPREHENSIVE METABOLIC PANEL - Abnormal; Notable for the following components:      Result Value   Glucose, Bld 135 (*)    BUN 25 (*)    Total Protein 6.2 (*)    Albumin 3.4 (*)    All other components within normal limits  URINALYSIS, ROUTINE W REFLEX MICROSCOPIC - Abnormal; Notable for the following components:   Color, Urine AMBER (*)    APPearance CLOUDY (*)    Protein, ur 30 (*)    All other components within  normal limits  CULTURE, BLOOD (ROUTINE X 2)  CULTURE, BLOOD (ROUTINE X 2)  SARS CORONAVIRUS 2 BY RT PCR  CBC WITH DIFFERENTIAL/PLATELET  TSH  AMMONIA  VITAMIN B12  PROCALCITONIN  TROPONIN I (HIGH SENSITIVITY)    EKG Atrial fibrillation with rate of 66 bpm.  Normal axis and intervals.  No evidence of acute ischemia.  RADIOLOGY CT head interpreted by me without evidence of acute intracranial pathology CT of cervical spine interpreted by me without evidence of fracture or dislocation. Plain film of the pelvis and left hip interpreted by me without evidence of fracture or dislocation. CXR interpreted by me without evidence of acute cardiopulmonary pathology.  Official radiology report(s): CT HEAD WO CONTRAST ( )  Result Date: 09/15/2021 CLINICAL DATA:  Fall last night. EXAM: CT HEAD WITHOUT CONTRAST CT CERVICAL SPINE WITHOUT CONTRAST TECHNIQUE: Multidetector CT imaging of the head and cervical spine was performed following the standard protocol without intravenous contrast. Multiplanar  CT image reconstructions of the cervical spine were also generated. RADIATION DOSE REDUCTION: This exam was performed according to the departmental dose-optimization program which includes automated exposure control, adjustment of the mA and/or kV according to patient size and/or use of iterative reconstruction technique. COMPARISON:  CT head 01/01/2021 FINDINGS: CT HEAD FINDINGS Brain: Negative for acute infarct, hemorrhage, mass Mild atrophy. Chronic left occipital infarct. Small chronic infarct right cerebellum. Chronic microvascular ischemic change in the white matter. Vascular: Negative for hyperdense vessel Skull: Negative Sinuses/Orbits: Mild mucosal edema right maxillary sinus. Remaining sinuses clear. Bilateral cataract extraction Other: None CT CERVICAL SPINE FINDINGS Alignment: Mild retrolisthesis C4-5 and C5-6 Skull base and vertebrae: Negative for fracture Soft tissues and spinal canal: No soft tissue mass or edema. Surgical clips in the left neck. Disc levels: Disc and facet degeneration in the cervical spine. Disc degeneration and spurring most prominent C3 through C6. No significant spinal stenosis. Upper chest: Lung apices clear bilaterally Other: None IMPRESSION: 1. No acute intracranial injury. Chronic ischemic changes most notably chronic left PCA infarct 2. Negative for cervical spine fracture. Mild to moderate cervical spondylosis. Electronically Signed   By: Marlan Palau M.D.   On: 09/15/2021 11:32   CT Cervical Spine Wo Contrast  Result Date: 09/15/2021 CLINICAL DATA:  Fall last night. EXAM: CT HEAD WITHOUT CONTRAST CT CERVICAL SPINE WITHOUT CONTRAST TECHNIQUE: Multidetector CT imaging of the head and cervical spine was performed following the standard protocol without intravenous contrast. Multiplanar CT image reconstructions of the cervical spine were also generated. RADIATION DOSE REDUCTION: This exam was performed according to the departmental dose-optimization program which  includes automated exposure control, adjustment of the mA and/or kV according to patient size and/or use of iterative reconstruction technique. COMPARISON:  CT head 01/01/2021 FINDINGS: CT HEAD FINDINGS Brain: Negative for acute infarct, hemorrhage, mass Mild atrophy. Chronic left occipital infarct. Small chronic infarct right cerebellum. Chronic microvascular ischemic change in the white matter. Vascular: Negative for hyperdense vessel Skull: Negative Sinuses/Orbits: Mild mucosal edema right maxillary sinus. Remaining sinuses clear. Bilateral cataract extraction Other: None CT CERVICAL SPINE FINDINGS Alignment: Mild retrolisthesis C4-5 and C5-6 Skull base and vertebrae: Negative for fracture Soft tissues and spinal canal: No soft tissue mass or edema. Surgical clips in the left neck. Disc levels: Disc and facet degeneration in the cervical spine. Disc degeneration and spurring most prominent C3 through C6. No significant spinal stenosis. Upper chest: Lung apices clear bilaterally Other: None IMPRESSION: 1. No acute intracranial injury. Chronic ischemic changes most notably chronic left PCA infarct 2.  Negative for cervical spine fracture. Mild to moderate cervical spondylosis. Electronically Signed   By: Marlan Palau M.D.   On: 09/15/2021 11:32   DG Knee Complete 4 Views Left  Result Date: 09/15/2021 CLINICAL DATA:  88 year old female with history of fall, altered mental status. EXAM: LEFT KNEE - COMPLETE 4+ VIEW COMPARISON:  04/18/2021 FINDINGS: No evidence of fracture, dislocation, or joint effusion. Postsurgical changes after left total knee arthroplasty without evidence of hardware loosening, fracture, or malalignment. Atherosclerotic calcifications are noted. IMPRESSION: 1. No acute fracture or malalignment. 2. Status post left total knee arthroplasty without complicating features. Electronically Signed   By: Marliss Coots M.D.   On: 09/15/2021 11:21   DG HIP UNILAT WITH PELVIS 2-3 VIEWS LEFT  Result  Date: 09/15/2021 CLINICAL DATA:  Fall. EXAM: DG HIP (WITH OR WITHOUT PELVIS) 2-3V LEFT COMPARISON:  None Available. FINDINGS: No acute fracture or hip dislocation is identified. There is mild-to-moderate left hip joint space narrowing with mild marginal spurring. Mild hip joint space narrowing is also noted on the right. Atherosclerotic vascular calcifications are present. IMPRESSION: No acute osseous abnormality identified. Electronically Signed   By: Sebastian Ache M.D.   On: 09/15/2021 11:20   DG Chest Portable 1 View  Result Date: 09/15/2021 CLINICAL DATA:  88 year old female with altered mental status status post fall. EXAM: PORTABLE CHEST - 1 VIEW COMPARISON:  None Available. FINDINGS: The mediastinal contours are within normal limits. Unchanged cardiomegaly. Atherosclerotic calcification of the aortic arch. The lungs are clear bilaterally without evidence of focal consolidation, pleural effusion, or pneumothorax. Cholecystectomy clips in the right upper quadrant. No acute osseous abnormality. IMPRESSION: 1. No acute cardiopulmonary process. 2. Unchanged cardiomegaly. 3.  Aortic Atherosclerosis (ICD10-I70.0). Electronically Signed   By: Marliss Coots M.D.   On: 09/15/2021 11:17    PROCEDURES and INTERVENTIONS:  .1-3 Lead EKG Interpretation  Performed by: Delton Prairie, MD Authorized by: Delton Prairie, MD     Interpretation: normal     ECG rate:  70   ECG rate assessment: normal     Rhythm: atrial fibrillation     Ectopy: none     Conduction: normal   .Critical Care  Performed by: Delton Prairie, MD Authorized by: Delton Prairie, MD   Critical care provider statement:    Critical care time (minutes):  30   Critical care time was exclusive of:  Separately billable procedures and treating other patients   Critical care was necessary to treat or prevent imminent or life-threatening deterioration of the following conditions:  Circulatory failure   Critical care was time spent personally by me on  the following activities:  Development of treatment plan with patient or surrogate, discussions with consultants, evaluation of patient's response to treatment, examination of patient, ordering and review of laboratory studies, ordering and review of radiographic studies, ordering and performing treatments and interventions, pulse oximetry, re-evaluation of patient's condition and review of old charts   Medications  mirtazapine (REMERON) tablet 7.5 mg (has no administration in time range)  levothyroxine (SYNTHROID) tablet 25 mcg (has no administration in time range)  acetaminophen (TYLENOL) tablet 650 mg (has no administration in time range)    Or  acetaminophen (TYLENOL) suppository 650 mg (has no administration in time range)  ondansetron (ZOFRAN) tablet 4 mg (has no administration in time range)    Or  ondansetron (ZOFRAN) injection 4 mg (has no administration in time range)  lactated ringers bolus 1,000 mL (has no administration in time range)  senna-docusate (Senokot-S) tablet  1 tablet (has no administration in time range)  midodrine (PROAMATINE) tablet 10 mg (has no administration in time range)  lactated ringers bolus 1,000 mL (0 mLs Intravenous Stopped 09/15/21 1242)  acetaminophen (TYLENOL) tablet 1,000 mg (1,000 mg Oral Given 09/15/21 1215)     IMPRESSION / MDM / ASSESSMENT AND PLAN / ED COURSE  I reviewed the triage vital signs and the nursing notes.  Differential diagnosis includes, but is not limited to, stroke, intracranial hemorrhage, sepsis, cystitis, anemia, ACS  {Patient presents with symptoms of an acute illness or injury that is potentially life-threatening.  88 year old female presents from her SNF with altered mentation and slurred speech from her baseline.  She is outside of any interventional window for possible stroke.  Due to her recent fall and being on anticoagulation, CT head obtained and without evidence of ICH or signs of acute stroke.  Has some poorly  localizing pain to her left lower extremity, but no signs of fracture or overlying deformity or trauma.  Urine without infectious features and blood work is benign with normal CBC and essentially normal metabolic panel. She has soft blood pressures improving with IV fluids, she is maintaining her maps and not requiring vasopressors.  Due to her change in mental status, we will consult medicine for admission.  Clinical Course as of 09/15/21 1246  Wed Sep 15, 2021  1152 Reassessed.  Daughter at the bedside provides some helpful history [DS]  1239 I consult hospitalist who agrees to admit. [DS]    Clinical Course User Index [DS] Delton Prairie, MD     FINAL CLINICAL IMPRESSION(S) / ED DIAGNOSES   Final diagnoses:  None     Rx / DC Orders   ED Discharge Orders     None        Note:  This document was prepared using Dragon voice recognition software and may include unintentional dictation errors.   Delton Prairie, MD 09/15/21 1250

## 2021-09-15 NOTE — H&P (Signed)
History and Physical   Gabrielle Navarro CNO:[id number] DOB: June 05, 1927 DOA: 09/15/2021  PCP: Laverle Hobby, MD  Outpatient Specialists: Dr. Sherryll Burger, neurology Patient coming from: [facility] facility  I have personally briefly reviewed patient's old medical records in [facility] EMR.  Chief Concern: Altered mental status  HPI: Mrs. Gabrielle Navarro is a 88 year old female with history of hypothyroid, hypertension, insomnia, atrial fibrillation on Eliquis, chronic left PCA infarct, chronic right cerebellar infarct, cervical spondylosis, who presents emergency department from [facility] facility for chief concerns of altered mental status and falls.  Initial vitals in the emergency department showed temperature 97.3, respiration rate of 18, heart rate of 71, blood pressure 103/60, SPO2 99% on room air.  Serum sodium is 139, potassium 3.9, chloride 105, bicarb 25, BUN of 25, serum creatinine of 0.88, GFR greater than 60, nonfasting blood glucose 135, WBC 7.5, hemoglobin 13.2, platelets of 214.  WBC 7.5, hemoglobin 13.2, platelets of 214.  High sensitive troponin is 8. UA was negative for leukocytes and nitrates  ED treatment: Tylenol 1 g, LR 1 L bolus. -------------- At bedside, she knows her daugther.  She has difficulty hearing bilaterally, was not able to tell me her name, her age, or her current location.  Unclear if she does not know these things or whether she could not hear me.  H&P obtained via daughter at bedside.  Daughter and legal HPOA, Gabrielle Navarro, was called by faciliity for patient 'not the same'. Facility gave patient a shower in the a.m. as usual, patient's ate her breakfast, and visited another resident. She started crying during this social visit saying that she was hurting. She did not indicate where to facility staff. When EMT arrived, she said her chest and knees were hurting.  When daughter arrived, and patient minimally responded and said 'something is wrong'. When  patient arrived to ED, daughter states that she seemed to improve however patient's speech was more slowed, delayed, and difficult for patient to get words out.  The delayed and difficulty getting her words out is unusual for patient at baseline.  Patient stated to daughter, ' I fell coming to see you'. Facility states that she didn't fall, she slid transferring from wheelchair to chair. There was no reported head trauma.   Daughter states that overall she is 'disoriented'. Daughter endorses that patient has had poor PO intake over the last few months.  At baseline daughter states she recognizes her daughter, children. She would not know the current year, month, her birthday, or the fact that her husband passed away last year.  Daughter states that patient reads to the daughter, and reads the Bible to her daughter during their visits.  Social history: She is from [place]. Daughter states that she was never a tobacco user, etoh, or recreational drug use. She is retired and formerly was an Char[employer] fundraiser.  ROS: Unable to complete due to baseline dementia  ED Course: Discussed with emergency medicine provider, patient requiring hospitalization for chief concerns of altered mental status.  Assessment/Plan  Principal Problem:   Altered mental status Active Problems:   Atrial fibrillation, chronic (HCC)   Essential hypertension   Hypothyroidism   Weakness   Hypotension   Pupils of different size   Protein-calorie malnutrition, severe (HCC)   DNR (do not resuscitate)   Assessment and Plan:  * Altered mental status - Etiology work-up in progress - Check blood cultures x2, TSH, ammonia level, B12, procalcitonin for baseline evaluation - Given daughter's endorsement of bilateral lower extremity swelling  over the last month, I have ordered BNP and complete echo to assess for heart function - Admit to stepdown, observation  Pupils of different size Baseline on left is 4-5 mm and do not dilate Right  is regular shape and size and does dilate with light  Hypotension - Etiology work-up in progress, query hypovolemia secondary to poor p.o. intake - Status post LR 1 L bolus per EDP - I ordered additional LR 1 L bolus - We will order midodrine once as needed for MAP less than 65 after completion of second LR bolus - Admit to stepdown, inpatient  Essential hypertension - Home carvedilol 6.25 mg p.o. twice daily, losartan 25 mg daily, furosemide 20 mg daily have been held at this time due to hypotension on presentation - Per daughter at bedside patient has taken these medications via facility prior to ED presentation - AM team to resume when appropriate  Atrial fibrillation, chronic (HCC) - Resumed home Eliquis 2.5 mg p.o. twice daily - Holding home carvedilol 6.25 mg p.o. twice daily at this time due to hypotension on presentation  Chart reviewed.   DVT prophylaxis: Eliquis Code Status: DNR/DNI, MOST form at bedside Diet: Vegetarian diet because patient does not eat red meat.  Daughter states that patient prefers vegetables, and will eat chicken and fish. Family Communication: Updated daughter, Gabrielle Navarro, at bedside Disposition Plan: Pending clinical course Consults called: PT, OT Admission status: Inpatient, stepped  Past Medical History:  Diagnosis Date   Atrial fibrillation (HCC)    Dementia (HCC)    GERD (gastroesophageal reflux disease)    History reviewed. No pertinent surgical history.  Social History:  reports that she has never smoked. She has never used smokeless tobacco. She reports that she does not drink alcohol and does not use drugs.  Allergies  Allergen Reactions   Amoxicillin Anaphylaxis    Patient has tolerated cefdinir within the last year.  See full allergy assessment progress note from 10/9   Shellfish Allergy Anaphylaxis   Chocolate Flavor Hives   Sulfa Antibiotics Other (See Comments)   Latex Rash   History reviewed. No pertinent family  history. Family history: Family history reviewed and not pertinent  Prior to Admission medications   Medication Sig Start Date End Date Taking? Authorizing Provider  acetaminophen (TYLENOL) 325 MG tablet Take 2 tablets (650 mg total) by mouth every 6 (six) hours as needed for mild pain or moderate pain (or temp > 37.5 C (99.5 F)). 01/05/21   Alford Highland, MD  apixaban (ELIQUIS) 2.5 MG TABS tablet Take 2.5 mg by mouth 2 (two) times daily.  05/04/18   [provider]  carvedilol (COREG) 6.25 MG tablet Take 6.25 mg by mouth 2 (two) times daily with a meal.  01/01/18   [provider]  furosemide (LASIX) 20 MG tablet Take by mouth. 09/01/21   [provider]  HYDROcodone-acetaminophen (NORCO/VICODIN) 5-325 MG tablet Take 1 tablet by mouth every 6 (six) hours as needed. 04/18/21   Ward, Layla Maw, DO  lactose free nutrition (BOOST) LIQD Take 237 mLs by mouth 3 (three) times daily between meals.    [provider]  LAGEVRIO 200 MG CAPS capsule Take by mouth. 05/24/21   [provider]  levothyroxine (SYNTHROID, LEVOTHROID) 25 MCG tablet Take 25 mcg by mouth daily before breakfast.  02/09/16   [provider]  losartan (COZAAR) 50 MG tablet Take 50 mg by mouth 2 (two) times a day. 07/02/18   [provider]  memantine (  NAMENDA XR) 28 MG CP24 24 hr capsule Take 28 mg by mouth daily.    [provider]  mirtazapine (REMERON) 15 MG tablet Take 7.5 mg by mouth daily. 08/24/21   [provider]  mirtazapine (REMERON) 7.5 MG tablet Take 7.5 mg by mouth at bedtime.    [provider]  MUCUS RELIEF 400 MG TABS tablet Take by mouth. 05/24/21   [provider]  omeprazole (PRILOSEC) 10 MG capsule Take 10 mg by mouth daily.  04/07/18   [provider]  ondansetron (ZOFRAN) 4 MG tablet Take by mouth 2 (two) times daily. 04/18/21   [provider]  ondansetron (ZOFRAN-ODT) 4 MG disintegrating tablet Take 1  tablet (4 mg total) by mouth every 6 (six) hours as needed for nausea or vomiting. 04/18/21   Ward, Layla Maw, DO  senna-docusate (SENOKOT-S) 8.6-50 MG tablet Take 1 tablet by mouth at bedtime as needed for mild constipation. 01/05/21   Alford Highland, MD  STOOL SOFTENER 100 MG capsule Take by mouth. 08/31/21   [provider]  traZODone (DESYREL) 100 MG tablet Take 100 mg by mouth at bedtime.  12/11/17   [provider]  QUEtiapine (SEROQUEL) 25 MG tablet Take 25 mg by mouth every evening. 07/20/18 09/26/18  [provider]   Physical Exam: Vitals:   09/15/21 1215 09/15/21 1230 09/15/21 1300 09/15/21 1410  BP: (!) 96/48 (!) 87/47 (!) 103/47 (!) 94/52  Pulse: 66 63 70   Resp: 19 12 14 16   Temp:      TempSrc:      SpO2: 99% 100% 97%   Weight:       Constitutional: appears age-appropriate, frail, protecting airway, NAD, calm, comfortable Eyes: PERRL, lids and conjunctivae normal ENMT: Mucous membranes are moist. Posterior pharynx clear of any exudate or lesions. Age-appropriate dentition.  Severe bilateral hearing loss. Neck: normal, supple, no masses, no thyromegaly Respiratory: clear to auscultation bilaterally, no wheezing, no crackles. Normal respiratory effort. No accessory muscle use.  Cardiovascular: Regular rate and rhythm, no murmurs / rubs / gallops.  Bilateral lower extremity edema. 2+ pedal pulses. No carotid bruits.  Abdomen: no tenderness, no masses palpated, no hepatosplenomegaly. Bowel sounds positive.  Musculoskeletal: no clubbing / cyanosis. No joint deformity upper and lower extremities. Good ROM, no contractures, no atrophy. Normal muscle tone.  Skin: no rashes, lesions, ulcers. No induration Neurologic: Sensation intact. Strength 5/5 in all 4.  Psychiatric: Lacks judgment and insight due to dementia. Alert and oriented to daughter at bedside. Normal mood.   EKG: independently reviewed, showing atrial fibrillation with rate of 66, QTc 473  Chest  x-ray on Admission: I personally reviewed and I agree with radiologist reading as below.  MR BRAIN WO CONTRAST  Result Date: 09/15/2021 CLINICAL DATA:  Acute neuro deficit.  Fall last night. EXAM: MRI HEAD WITHOUT CONTRAST TECHNIQUE: Multiplanar, multiecho pulse sequences of the brain and surrounding structures were obtained without intravenous contrast. COMPARISON:  CT head 09/15/2021 FINDINGS: Brain: Negative for acute infarct. Mild atrophy, generalized atrophy with more advanced atrophy in the hippocampus bilaterally. This may be associated with Alzheimer's type dementia. Chronic hemorrhagic infarct left occipital lobe. Mild chronic microvascular ischemic change in the white matter. Chronic microhemorrhage left parietal lobe. Small chronic infarct right cerebellum Vascular: Normal arterial flow voids at the skull base Skull and upper cervical spine: No focal lesion. Sinuses/Orbits: Negative Other: None IMPRESSION: Negative for acute infarct. Prominent hippocampal atrophy bilaterally Chronic ischemic changes above. Electronically Signed   By:  Marlan Palauharles  Clark M.D.   On: 09/15/2021 14:15   CT HEAD WO CONTRAST (5MM)  Result Date: 09/15/2021 CLINICAL DATA:  Fall last night. EXAM: CT HEAD WITHOUT CONTRAST CT CERVICAL SPINE WITHOUT CONTRAST TECHNIQUE: Multidetector CT imaging of the head and cervical spine was performed following the standard protocol without intravenous contrast. Multiplanar CT image reconstructions of the cervical spine were also generated. RADIATION DOSE REDUCTION: This exam was performed according to the departmental dose-optimization program which includes automated exposure control, adjustment of the mA and/or kV according to patient size and/or use of iterative reconstruction technique. COMPARISON:  CT head 01/01/2021 FINDINGS: CT HEAD FINDINGS Brain: Negative for acute infarct, hemorrhage, mass Mild atrophy. Chronic left occipital infarct. Small chronic infarct right cerebellum. Chronic  microvascular ischemic change in the white matter. Vascular: Negative for hyperdense vessel Skull: Negative Sinuses/Orbits: Mild mucosal edema right maxillary sinus. Remaining sinuses clear. Bilateral cataract extraction Other: None CT CERVICAL SPINE FINDINGS Alignment: Mild retrolisthesis C4-5 and C5-6 Skull base and vertebrae: Negative for fracture Soft tissues and spinal canal: No soft tissue mass or edema. Surgical clips in the left neck. Disc levels: Disc and facet degeneration in the cervical spine. Disc degeneration and spurring most prominent C3 through C6. No significant spinal stenosis. Upper chest: Lung apices clear bilaterally Other: None IMPRESSION: 1. No acute intracranial injury. Chronic ischemic changes most notably chronic left PCA infarct 2. Negative for cervical spine fracture. Mild to moderate cervical spondylosis. Electronically Signed   By: Marlan Palauharles  Clark M.D.   On: 09/15/2021 11:32   CT Cervical Spine Wo Contrast  Result Date: 09/15/2021 CLINICAL DATA:  Fall last night. EXAM: CT HEAD WITHOUT CONTRAST CT CERVICAL SPINE WITHOUT CONTRAST TECHNIQUE: Multidetector CT imaging of the head and cervical spine was performed following the standard protocol without intravenous contrast. Multiplanar CT image reconstructions of the cervical spine were also generated. RADIATION DOSE REDUCTION: This exam was performed according to the departmental dose-optimization program which includes automated exposure control, adjustment of the mA and/or kV according to patient size and/or use of iterative reconstruction technique. COMPARISON:  CT head 01/01/2021 FINDINGS: CT HEAD FINDINGS Brain: Negative for acute infarct, hemorrhage, mass Mild atrophy. Chronic left occipital infarct. Small chronic infarct right cerebellum. Chronic microvascular ischemic change in the white matter. Vascular: Negative for hyperdense vessel Skull: Negative Sinuses/Orbits: Mild mucosal edema right maxillary sinus. Remaining sinuses  clear. Bilateral cataract extraction Other: None CT CERVICAL SPINE FINDINGS Alignment: Mild retrolisthesis C4-5 and C5-6 Skull base and vertebrae: Negative for fracture Soft tissues and spinal canal: No soft tissue mass or edema. Surgical clips in the left neck. Disc levels: Disc and facet degeneration in the cervical spine. Disc degeneration and spurring most prominent C3 through C6. No significant spinal stenosis. Upper chest: Lung apices clear bilaterally Other: None IMPRESSION: 1. No acute intracranial injury. Chronic ischemic changes most notably chronic left PCA infarct 2. Negative for cervical spine fracture. Mild to moderate cervical spondylosis. Electronically Signed   By: Marlan Palauharles  Clark M.D.   On: 09/15/2021 11:32   DG Knee Complete 4 Views Left  Result Date: 09/15/2021 CLINICAL DATA:  88 year old female with history of fall, altered mental status. EXAM: LEFT KNEE - COMPLETE 4+ VIEW COMPARISON:  04/18/2021 FINDINGS: No evidence of fracture, dislocation, or joint effusion. Postsurgical changes after left total knee arthroplasty without evidence of hardware loosening, fracture, or malalignment. Atherosclerotic calcifications are noted. IMPRESSION: 1. No acute fracture or malalignment. 2. Status post left total knee arthroplasty without complicating features. Electronically Signed  By: Marliss Coots M.D.   On: 09/15/2021 11:21   DG HIP UNILAT WITH PELVIS 2-3 VIEWS LEFT  Result Date: 09/15/2021 CLINICAL DATA:  Fall. EXAM: DG HIP (WITH OR WITHOUT PELVIS) 2-3V LEFT COMPARISON:  None Available. FINDINGS: No acute fracture or hip dislocation is identified. There is mild-to-moderate left hip joint space narrowing with mild marginal spurring. Mild hip joint space narrowing is also noted on the right. Atherosclerotic vascular calcifications are present. IMPRESSION: No acute osseous abnormality identified. Electronically Signed   By: Sebastian Ache M.D.   On: 09/15/2021 11:20   DG Chest Portable 1  View  Result Date: 09/15/2021 CLINICAL DATA:  88 year old female with altered mental status status post fall. EXAM: PORTABLE CHEST - 1 VIEW COMPARISON:  None Available. FINDINGS: The mediastinal contours are within normal limits. Unchanged cardiomegaly. Atherosclerotic calcification of the aortic arch. The lungs are clear bilaterally without evidence of focal consolidation, pleural effusion, or pneumothorax. Cholecystectomy clips in the right upper quadrant. No acute osseous abnormality. IMPRESSION: 1. No acute cardiopulmonary process. 2. Unchanged cardiomegaly. 3.  Aortic Atherosclerosis (ICD10-I70.0). Electronically Signed   By: Marliss Coots M.D.   On: 09/15/2021 11:17    Labs on Admission: I have personally reviewed following labs  CBC: Recent Labs  Lab 09/15/21 1047  WBC 7.5  NEUTROABS 5.8  HGB 13.2  HCT 41.4  MCV 93.7  PLT 214   Basic Metabolic Panel: Recent Labs  Lab 09/15/21 1047  NA 139  K 3.9  CL 105  CO2 25  GLUCOSE 135*  BUN 25*  CREATININE 0.88  CALCIUM 9.6   GFR: Estimated Creatinine Clearance: 37.5 mL/min (by C-G formula based on SCr of 0.88 mg/dL).  Liver Function Tests: Recent Labs  Lab 09/15/21 1047  AST 27  ALT 13  ALKPHOS 65  BILITOT 0.7  PROT 6.2*  ALBUMIN 3.4*   Urine analysis:    Component Value Date/Time   COLORURINE AMBER (A) 09/15/2021 1047   APPEARANCEUR CLOUDY (A) 09/15/2021 1047   LABSPEC 1.018 09/15/2021 1047   PHURINE 7.0 09/15/2021 1047   GLUCOSEU NEGATIVE 09/15/2021 1047   HGBUR NEGATIVE 09/15/2021 1047   BILIRUBINUR NEGATIVE 09/15/2021 1047   KETONESUR NEGATIVE 09/15/2021 1047   PROTEINUR 30 (A) 09/15/2021 1047   NITRITE NEGATIVE 09/15/2021 1047   LEUKOCYTESUR NEGATIVE 09/15/2021 1047   CRITICAL CARE Performed by: Nadyne Coombes Johnpatrick Jenny  Total critical care time: 35 minutes  Critical care time was exclusive of separately billable procedures and treating other patients.  Critical care was necessary to treat or prevent imminent or  life-threatening deterioration.  Critical care was time spent personally by me on the following activities: development of treatment plan with patient and/or surrogate as well as nursing, discussions with consultants, evaluation of patient's response to treatment, examination of patient, obtaining history from patient or surrogate, ordering and performing treatments and interventions, ordering and review of laboratory studies, ordering and review of radiographic studies, pulse oximetry and re-evaluation of patient's condition.  Dr. Sedalia Muta [facility]  If 7PM-7AM, please contact overnight-coverage provider If 7AM-7PM, please contact day coverage provider [email or web address]  09/15/2021, 2:47 PM

## 2021-09-16 ENCOUNTER — Inpatient Hospital Stay
Admit: 2021-09-16 | Discharge: 2021-09-16 | Disposition: A | Discharge status: Home / Self Care | Attending: Internal Medicine | Admitting: Internal Medicine

## 2021-09-16 DIAGNOSIS — R4182 Altered mental status, unspecified: Secondary | ICD-10-CM | POA: Diagnosis not present

## 2021-09-16 DIAGNOSIS — I482 Chronic atrial fibrillation, unspecified: Secondary | ICD-10-CM | POA: Diagnosis not present

## 2021-09-16 DIAGNOSIS — F0390 Unspecified dementia without behavioral disturbance: Secondary | ICD-10-CM

## 2021-09-16 DIAGNOSIS — R600 Localized edema: Secondary | ICD-10-CM

## 2021-09-16 LAB — BASIC METABOLIC PANEL
Anion gap: 5 (ref 5–15)
BUN: 24 mg/dL — ABNORMAL HIGH (ref 8–23)
CO2: 26 mmol/L (ref 22–32)
Calcium: 8.9 mg/dL (ref 8.9–10.3)
Chloride: 106 mmol/L (ref 98–111)
Creatinine, Ser: 0.87 mg/dL (ref 0.44–1.00)
GFR, Estimated: >60 mL/min (ref >60)
Glucose, Bld: 178 mg/dL — ABNORMAL HIGH (ref 70–99)
Potassium: 4 mmol/L (ref 3.5–5.1)
Sodium: 137 mmol/L (ref 135–145)

## 2021-09-16 LAB — CBC
HCT: 39.4 % (ref 36.0–46.0)
Hemoglobin: 12.5 g/dL (ref 12.0–15.0)
MCH: 29.7 pg (ref 26.0–34.0)
MCHC: 31.7 g/dL (ref 30.0–36.0)
MCV: 93.6 fL (ref 80.0–100.0)
Platelets: 235 10*3/uL (ref 150–400)
RBC: 4.21 MIL/uL (ref 3.87–5.11)
RDW: 13.2 % (ref 11.5–15.5)
WBC: 6.8 10*3/uL (ref 4.0–10.5)
nRBC: 0 % (ref 0.0–0.2)

## 2021-09-16 LAB — BLOOD CULTURE ID PANEL (REFLEXED) - BCID2

## 2021-09-16 LAB — ECHOCARDIOGRAM COMPLETE
AR max vel: 1.55 cm2
AV Area VTI: 1.65 cm2
AV Area mean vel: 1.5 cm2
AV Mean grad: 2 mmHg
AV Peak grad: 2.9 mmHg
Ao pk vel: 0.85 m/s
Area-P 1/2: 6.4 cm2
P 1/2 time: 916 msec
S' Lateral: 2.5 cm
Weight: 2225.76 oz

## 2021-09-16 MED ORDER — ENSURE ENLIVE PO LIQD
237.0000 mL | Freq: Three times a day (TID) | ORAL | Status: DC
  Administered 2021-09-16 – 2021-09-22 (×16): 237 mL via ORAL

## 2021-09-16 MED ORDER — MIDODRINE HCL 5 MG PO TABS
5.0000 mg | ORAL_TABLET | Freq: Three times a day (TID) | ORAL | Status: DC
  Administered 2021-09-16 – 2021-09-17 (×4): 5 mg via ORAL
  Filled 2021-09-16 (×4): qty 1

## 2021-09-16 MED ORDER — VANCOMYCIN HCL 1250 MG/250ML IV SOLN
1250.0000 mg | Freq: Once | INTRAVENOUS | Status: AC
  Administered 2021-09-16: 1250 mg via INTRAVENOUS
  Filled 2021-09-16: qty 250

## 2021-09-16 NOTE — Progress Notes (Signed)
PT Cancellation Note  Patient Details Name: Gabrielle Navarro MRN: [id number] DOB: 10/18/1927   Cancelled Treatment:    Reason Eval/Treat Not Completed:  (Consult received and chart reviewed.  Patient remains on pressors with plans for weaning.  Will hold at this time and re-attempt next date as medically appropriate and available to ensure medical stability prior to initiation of mobility.)  Of note, patient is active hospice patient; resident of [facility] at baseline (noted plans for facility to assess patient for return next date).  Fara Worthy H. Manson Passey, PT, DPT, NCS 09/16/21, 3:54 PM [phone]

## 2021-09-16 NOTE — Progress Notes (Signed)
OT Cancellation Note  Patient Details Name: Gabrielle Navarro MRN: [id number] DOB: Jul 24, 1927   Cancelled Treatment:    Reason Eval/Treat Not Completed: Medical issues which prohibited therapy. OT order received. Per chart review, pt currently on Levophed and will be held today per therapy protocol. OT to re-attempt when pt is medically able to participate.   Jackquline Denmark, MS, OTR/L , CBIS ascom [phone]  09/16/21, 8:38 AM

## 2021-09-16 NOTE — Progress Notes (Signed)
*  PRELIMINARY RESULTS* Echocardiogram 2D Echocardiogram has been performed.  Gabrielle Navarro 09/16/2021, 8:49 AM

## 2021-09-16 NOTE — Progress Notes (Signed)
[facility] ED 7 [facility]) hospitalized hospice patient visit  Ms. Porcia Morganti is a current [facility] patient with a terminal diagnosis of Abnormal weight loss. She resides in an ALF and began experiencing altered mental status with a fall on the night prior to coming to ED. [facility] was notified after patient was sent to ED. She is currently admitted for the AMS pending further work up. Per Dr. Gordy Savers with [facility] this is a related hospital admission.   Visited with patient in ED room where she is holding with daughter at bedside and attending in for assessment. Patient is currently requiring IV Levophed for soft blood pressures however all other tests have so far come back ok for the most part. She is pending results of an Echocardiogram performed earlier this morning. She is mainly sleeping, but able to wake up and take oral meds for bedside nurse. Good conversation with both attending MD and daughter who wishes to treat the treatable and if it is possible to make changes to her medications to allow her to return her PLOF then this is her desire.   Patient is inpatient appropriate due to need for IV Vasoconstrictor and ICU level of care.   Vital Signs-  97.6/53/15   109/47   97% room air Intake/Output- not recorded Abnormal labs- BUN 24, Albumin 3.4, Total Protein 6.2, BNP 212 Diagnostics-  Plain films of chest, hip and knee; CT of Head & Cervical Spine; MRI of brain: are all without any acute changes or injuries noted. Echocardiogram results pending IV/PRN Meds- LR 1Liter x2, NS @250  x1Liter, Levophed 87mcg/m, Tylenol 650mg  po x1, Midodrine 10mg  x1 Problem List Altered mental status - Etiology work-up in progress - Check blood cultures x2, TSH, ammonia level, B12, procalcitonin for baseline evaluation - Given daughter's endorsement of bilateral lower extremity swelling over the last month, I have ordered BNP and complete echo to assess for heart function - Admit to stepdown, observation   Pupils of different size Baseline on left is 4-5 mm and do not dilate Right is regular shape and size and does dilate with light  Hypotension - Etiology work-up in progress, query hypovolemia secondary to poor p.o. intake - Status post LR 1 L bolus per EDP - I ordered additional LR 1 L bolus - We will order midodrine once as needed for MAP less than 65 after completion of second LR bolus - Admit to stepdown, inpatient  Essential hypertension - Home carvedilol 6.25 mg p.o. twice daily, losartan 25 mg daily, furosemide 20 mg daily have been held at this time due to hypotension on presentation - Per daughter at bedside patient has taken these medications via facility prior to ED presentation - AM team to resume when appropriate  Atrial fibrillation, chronic (HCC) - Resumed home Eliquis 2.5 mg p.o. twice daily - Holding home carvedilol 6.25 mg p.o. twice daily at this time due to hypotension on presentation   Discharge Planning- Ongoing, if BP stabilized likely back to ALF in next day or two Family Contact- talked with daughter at bedside IDT- Updated Goals of care - Per daughter to try and treat the treatable, if this isn't possible with medications then may shift to more comfort focused care  3m, , BSN, [facility] Liaison [phone]

## 2021-09-16 NOTE — TOC Initial Note (Addendum)
Transition of Care [facility]) - Initial/Assessment Note    Patient Details  Name: Gabrielle Navarro MRN: [id number] Date of Birth: [birth year]  Transition of Care [facility]) CM/SW Contact:    Gildardo Griffes, LCSW Phone Number: 09/16/2021, 2:54 PM  Clinical Narrative:                  CSW notes per rounds patient potential to discharge back to [facility] with [facility] tomorrow.   CSW has informed Tammy with [facility] of above update.   [facility] hosp[facility] has been updated.   Per [facility] they will need to assess patient tomorrow to ensure she can return to make sure they can pivot, will need to call Cristopher Peru at [phone] to come assess.  TOC will continue to follow for needs.   Expected Discharge Plan: Assisted Living Barriers to Discharge: Continued Medical Work up   Patient Goals and CMS Choice Patient states their goals for this hospitalization and ongoing recovery are:: to go home CMS Medicare.gov Compare Post Acute Care list provided to:: Patient Represenative (must comment) Choice offered to / list presented to : [facility] POA / Guardian  Expected Discharge Plan and Services Expected Discharge Plan: Assisted Living       Living arrangements for the past 2 months: Assisted Living Facility ([facility])                                      Prior Living Arrangements/Services Living arrangements for the past 2 months: Assisted Living Facility ([facility]) Lives with:: Self                   Activities of Daily Living      Permission Sought/Granted                  Emotional Assessment              Admission diagnosis:  Altered mental status [R41.82] Patient Active Problem List   Diagnosis Date Noted   Altered mental status 09/15/2021   Hypotension 09/15/2021   Pupils of different size 09/15/2021   Protein-calorie malnutrition, severe (HCC) 09/15/2021   DNR (do not resuscitate) 09/15/2021   Pseudogout    AKI (acute kidney injury)  (HCC)    Knee pain 01/02/2021   Acute pain of right knee    Pain of toe of right foot    Dehydration    Impaired fasting glucose    Anxiety and depression    Weakness 01/01/2021   TIA (transient ischemic attack) 01/01/2021   Facial cellulitis    Dental abscess    Cellulitis 10/03/2020   Atrial fibrillation, chronic (HCC) 09/06/2019   Dementia without behavioral disturbance (HCC) 09/06/2019   HOH (hard of hearing) 09/06/2019   GERD (gastroesophageal reflux disease) 09/06/2019   Essential hypertension 09/06/2019   Hypothyroidism 09/06/2019   Syncope and collapse 09/06/2019   PCP:  Laverle Hobby, MD Pharmacy:   [facility] [phone] [place] Ditto, [place] - [address] AT [facility] OF [address] [place] [address] [place] [address] Phone: [phone] Fax: [phone]  [facility] - [place], [place] - [address] [place] [address] Phone: [phone] Fax: [phone]     Social Determinants of Health (SDOH) Interventions    Readmission Risk Interventions     No data to display

## 2021-09-16 NOTE — Assessment & Plan Note (Signed)
Patient with declining health and advanced dementia.  History of poor p.o. intake. Might be a natural life trajectory. She is currently under hospice services. -PT evaluation

## 2021-09-16 NOTE — ED Notes (Signed)
Patient resting with eyes closed in stretcher. Respirations even and unlabored. NAD noted. Family member at bedside.

## 2021-09-16 NOTE — Assessment & Plan Note (Signed)
-   Continue home memantine 

## 2021-09-16 NOTE — Consult Note (Signed)
Pharmacy Antibiotic Note  Gabrielle Navarro is a 88 y.o. female admitted on 09/15/2021 with bacteremia.  Pharmacy has been consulted for Vancomycin dosing.  Plan: *Vancomycin 1250mg  IVPB x 1 dose until additional assessment and cultures resulted*  Weight: 63.1 kg (139 lb 1.8 oz)  No data recorded.  Recent Labs  Lab 09/15/21 1047 09/16/21 0614  WBC 7.5 6.8  CREATININE 0.88 0.87    Estimated Creatinine Clearance: 37.9 mL/min (by C-G formula based on SCr of 0.87 mg/dL).    Allergies  Allergen Reactions   Amoxicillin Anaphylaxis    Patient has tolerated cefdinir within the last year.  See full allergy assessment progress note from 10/9   Shellfish Allergy Anaphylaxis   Chocolate Flavor Hives   Sulfa Antibiotics Other (See Comments)   Latex Rash    Antimicrobials this admission: 09/16/21 Vancomycin >>   Dose adjustments this admission: none  Microbiology results: 6/29: BCx: 1 of 4 bottles (anaerobe). Staph species (no resistance)   Thank you for allowing pharmacy to be a part of this patient's care.  Travor Royce Rodriguez-Guzman PharmD, BCPS 09/16/2021 4:17 PM

## 2021-09-16 NOTE — Progress Notes (Signed)
PHARMACY - PHYSICIAN COMMUNICATION CRITICAL VALUE ALERT - BLOOD CULTURE IDENTIFICATION (BCID)  Providencia Hottenstein is an 88 y.o. female who presented to [facility] on 09/15/2021 with a chief complaint of AMS  Name of physician (or Provider) Contacted: Dr Kathie Rhodes. Amin  Current antibiotics: none  Changes to prescribed antibiotics recommended:  Recommendations accepted by provider  Start Vancomycin per pharmacy until additional assessment completed.  Results for orders placed or performed during the hospital encounter of 09/15/21  Blood Culture ID Panel (Reflexed) (Collected: 09/15/2021  1:09 PM)  Result Value Ref Range   Enterococcus faecalis NOT DETECTED NOT DETECTED   Enterococcus Faecium NOT DETECTED NOT DETECTED   Listeria monocytogenes NOT DETECTED NOT DETECTED   Staphylococcus species DETECTED (A) NOT DETECTED   Staphylococcus aureus (BCID) NOT DETECTED NOT DETECTED   Staphylococcus epidermidis NOT DETECTED NOT DETECTED   Staphylococcus lugdunensis NOT DETECTED NOT DETECTED   Streptococcus species NOT DETECTED NOT DETECTED   Streptococcus agalactiae NOT DETECTED NOT DETECTED   Streptococcus pneumoniae NOT DETECTED NOT DETECTED   Streptococcus pyogenes NOT DETECTED NOT DETECTED   A.calcoaceticus-baumannii NOT DETECTED NOT DETECTED   Bacteroides fragilis NOT DETECTED NOT DETECTED   Enterobacterales NOT DETECTED NOT DETECTED   Enterobacter cloacae complex NOT DETECTED NOT DETECTED   Escherichia coli NOT DETECTED NOT DETECTED   Klebsiella aerogenes NOT DETECTED NOT DETECTED   Klebsiella oxytoca NOT DETECTED NOT DETECTED   Klebsiella pneumoniae NOT DETECTED NOT DETECTED   Proteus species NOT DETECTED NOT DETECTED   Salmonella species NOT DETECTED NOT DETECTED   Serratia marcescens NOT DETECTED NOT DETECTED   Haemophilus influenzae NOT DETECTED NOT DETECTED   Neisseria meningitidis NOT DETECTED NOT DETECTED   Pseudomonas aeruginosa NOT DETECTED NOT DETECTED   Stenotrophomonas maltophilia NOT  DETECTED NOT DETECTED   Candida albicans NOT DETECTED NOT DETECTED   Candida auris NOT DETECTED NOT DETECTED   Candida glabrata NOT DETECTED NOT DETECTED   Candida krusei NOT DETECTED NOT DETECTED   Candida parapsilosis NOT DETECTED NOT DETECTED   Candida tropicalis NOT DETECTED NOT DETECTED   Cryptococcus neoformans/gattii NOT DETECTED NOT DETECTED   Kaleel Schmieder Rodriguez-Guzman PharmD, BCPS 09/16/2021 4:13 PM

## 2021-09-16 NOTE — Assessment & Plan Note (Signed)
TSH mildly elevated at 4.770 -Continue home Synthroid. -Will need a repeat testing in 4 weeks

## 2021-09-16 NOTE — ED Notes (Signed)
Called dietary for new tray for pt

## 2021-09-16 NOTE — Progress Notes (Addendum)
Progress Note   Patient: Gabrielle Navarro CHE:[id number] DOB: 1928/02/05 DOA: 09/15/2021     1 DOS: the patient was seen and examined on 09/16/2021   Brief hospital course: Mrs. Jeroline Wolbert is a 88 year old female with history of hypothyroid, hypertension, insomnia, atrial fibrillation on Eliquis, chronic left PCA infarct, chronic right cerebellar infarct, cervical spondylosis, who presents emergency department from [facility] facility for chief concerns of altered mental status and falls.  Initial vitals in the emergency department showed temperature 97.3, respiration rate of 18, heart rate of 71, blood pressure 103/60, SPO2 99% on room air.  Serum sodium is 139, potassium 3.9, chloride 105, bicarb 25, BUN of 25, serum creatinine of 0.88, GFR greater than 60, nonfasting blood glucose 135, WBC 7.5, hemoglobin 13.2, platelets of 214.  WBC 7.5, hemoglobin 13.2, platelets of 214.  High sensitive troponin is 8. UA was negative for leukocytes and nitrates  ED treatment: Tylenol 1 g, LR 1 L bolus.  Patient with history of advanced dementia and at baseline recognizes daughter what she was doing in the ED.  CT head and MRI brain was without any acute abnormality. Other images which include chest x-ray, DG knee and hip was also without any acute abnormality or fractures. CT spine was negative for any acute abnormality. Per daughter patient has decreased p.o. intake for many months and gradually declining.  Per chart review patient is active with hospice services with [facility].   6/29: UA does not look infected.  Blood cultures pending.  No other obvious abnormality noted on labs except mildly elevated TSH at 4.7.  BNP at 212.  COVID PCR negative. echocardiogram with normal EF and no diastolic dysfunction.  Mildly dilated right and left atrium. Patient was placed on pressors overnight due to persistent hypotension.  We will try midodrine and try weaning her off from Levophed.   Assessment and  Plan: * Altered mental status Etiology remains unclear, no obvious or significant lab abnormalities. Currently at her baseline mental status. She was able to communicate and recognize her daughter. Might be due to hypotension causing some brain anoxia and it improves with improvement in blood pressure with vasopressors. Blood and urine cultures pending. -Continue to monitor  Hypotension Patient was on carvedilol, losartan and furosemide at home. Currently on Levophed due to persistent hypotension after getting 1 L of fluid. -Start her on midodrine -Try weaning from Levophed  Essential hypertension - Home carvedilol 6.25 mg p.o. twice daily, losartan 25 mg daily, furosemide 20 mg daily have been held at this time due to hypotension on presentation - Per daughter at bedside patient has taken these medications via facility prior to ED presentation -Continue to hold  Atrial fibrillation, chronic (HCC) - Resumed home Eliquis 2.5 mg p.o. twice daily - Holding home carvedilol 6.25 mg p.o. twice daily at this time due to hypotension on presentation  Hypothyroidism TSH mildly elevated at 4.770 -Continue home Synthroid. -Will need a repeat testing in 4 weeks  Weakness Patient with declining health and advanced dementia.  History of poor p.o. intake. Might be a natural life trajectory. She is currently under hospice services. -PT evaluation  Pupils of different size Baseline on left is 4-5 mm and do not dilate Right is regular shape and size and does dilate with light. CT head and MRI was without any acute abnormality  Protein-calorie malnutrition, moderate (HCC) Estimated body mass index is 23.15 kg/m as calculated from the following:   Height as of 04/17/21: 5\' 5"  (1.651 m).  Weight as of this encounter: 63.1 kg.   -Dietitian consult  Dementia without behavioral disturbance (HCC) - Continue home memantine  Bilateral lower extremity edema Patient was taking Lasix for lower  extremity edema. Mostly stays in wheelchair with hanging legs. Echocardiogram without any significant abnormality. Mildly elevated BNP at 212. Might be due to chronic venous stasis or dependent edema.  Mild hypoalbuminemia at 3.2 -Try raising legs -Compression stockings   Subjective: Patient was seen and examined today.  Quite hard of hearing.  No new complaints.  Still feeling weak.  Daughter at bedside  Physical Exam: Vitals:   09/16/21 1300 09/16/21 1330 09/16/21 1415 09/16/21 1500  BP: (!) 127/56 130/83 93/76 (!) 93/40  Pulse: 79 70 66 75  Resp: 16 19 (!) 29 14  Temp:      TempSrc:      SpO2: 97% 99% 98% 95%  Weight:       General.  Hard of hearing elderly lady, in no acute distress. Pulmonary.  Lungs clear bilaterally, normal respiratory effort. CV.  Regular rate and rhythm, no JVD, rub or murmur. Abdomen.  Soft, nontender, nondistended, BS positive. CNS.  Alert and oriented to self only.  No focal neurologic deficit. Extremities.  2+ LE edema, no cyanosis, pulses intact and symmetrical. Psychiatry.  Judgment and insight appears impaired  Data Reviewed: Prior data reviewed  Family Communication: Discussed with daughter at bedside  Disposition: Status is: Inpatient Remains inpatient appropriate because: Severity of illness   Planned Discharge Destination:  Back to long-term facility  DVT prophylaxis.  Eliquis Time spent: 52 minutes  This record has been created using Conservation officer, historic buildings. Errors have been sought and corrected,but may not always be located. Such creation errors do not reflect on the standard of care.  Author: Arnetha Courser, MD 09/16/2021 3:11 PM  For on call review [email or web address].

## 2021-09-16 NOTE — ED Notes (Signed)
Pericare performed and full linen change. Purewick applied.

## 2021-09-16 NOTE — Assessment & Plan Note (Signed)
Estimated body mass index is 23.15 kg/m as calculated from the following:   Height as of 04/17/21: 5\' 5"  (1.651 m).   Weight as of this encounter: 63.1 kg.   -Dietitian consult

## 2021-09-16 NOTE — Assessment & Plan Note (Signed)
Baseline on left is 4-5 mm and do not dilate Right is regular shape and size and does dilate with light. CT head and MRI was without any acute abnormality

## 2021-09-16 NOTE — Assessment & Plan Note (Signed)
Patient was taking Lasix for lower extremity edema. Mostly stays in wheelchair with hanging legs. Echocardiogram without any significant abnormality. Mildly elevated BNP at 212. Might be due to chronic venous stasis or dependent edema.  Mild hypoalbuminemia at 3.2 -Try raising legs -Compression stockings

## 2021-09-17 DIAGNOSIS — R4182 Altered mental status, unspecified: Secondary | ICD-10-CM

## 2021-09-17 MED ORDER — VANCOMYCIN HCL 1250 MG/250ML IV SOLN
1250.0000 mg | INTRAVENOUS | Status: DC
  Administered 2021-09-18: 1250 mg via INTRAVENOUS
  Filled 2021-09-17 (×2): qty 250

## 2021-09-17 MED ORDER — ADULT MULTIVITAMIN W/MINERALS CH
1.0000 | ORAL_TABLET | Freq: Every day | ORAL | Status: DC
  Administered 2021-09-17 – 2021-09-22 (×6): 1 via ORAL
  Filled 2021-09-17 (×6): qty 1

## 2021-09-17 NOTE — ED Notes (Signed)
Pt daughter requesting that we only draw one blood culture at this time.

## 2021-09-17 NOTE — Progress Notes (Signed)
Temp 100.1. Gave PRN acetaminophen 650mg .

## 2021-09-17 NOTE — ED Notes (Signed)
Provider made aware of the patient's Levo being D/C and maintaining stable Bps.

## 2021-09-17 NOTE — Assessment & Plan Note (Addendum)
Patient with declining health and advanced dementia.  History of poor p.o. intake. Might be a natural life trajectory. She is currently under hospice services. -PT evaluation-recommending returning to [facility] with home health PT.

## 2021-09-17 NOTE — Progress Notes (Signed)
Initial Nutrition Assessment  DOCUMENTATION CODES:   Not applicable  INTERVENTION:   -Continue Ensure Enlive po TID, each supplement provides 350 kcal and 20 grams of protein -MVI with minerals daily  NUTRITION DIAGNOSIS:   Predicted suboptimal nutrient intake related to chronic illness (dementia) as evidenced by estimated needs.  GOAL:   Patient will meet greater than or equal to 90% of their needs  MONITOR:   PO intake, Supplement acceptance  REASON FOR ASSESSMENT:   Malnutrition Screening Tool, Consult Assessment of nutrition requirement/status  ASSESSMENT:      Pt admitted with AMS.   Reviewed I/O's: -450 ml x 24 hours  UOP: 650 ml x 24 hours  Pt unavailable at time of visit. RD unable to obtain further nutrition-related history or complete nutrition-focused physical exam at this time.     Per chart review, pt does not eat red meat, but consumes, chicken, fish, and protein shakes. Pt is followed by hospice and has a history of dementia. She resides at [facility].   Pt currently on a a dysphagia 3 diet. No meal completion data currently available at this time. Noted Ensure shakes have been ordered.   Reviewed wt hx; no wt loss noted over the past 5 months.   Medications reviewed and include remeron.   Lab Results  Component Value Date   HGBA1C 6.3 (H) 01/01/2021   PTA DM medications are none.   Labs reviewed: CBGS: 106 (inpatient orders for glycemic control are none).    Diet Order:   Diet Order             DIET DYS 3 Room service appropriate? Yes; Fluid consistency: Thin  Diet effective now                   EDUCATION NEEDS:   No education needs have been identified at this time  Skin:  Skin Assessment: Reviewed RN Assessment  Last BM:  Unknown  Height:   Ht Readings from Last 1 Encounters:  04/17/21 5\' 5"  (1.651 m)    Weight:   Wt Readings from Last 1 Encounters:  09/15/21 63.1 kg    Ideal Body Weight:  56.8 kg  BMI:   Body mass index is 23.15 kg/m.  Estimated Nutritional Needs:   Kcal:  1700-1900  Protein:  80-95 grams  Fluid:  > 1.7 L    09/17/21, RD, LDN, CDCES Registered Dietitian II Certified Diabetes Care and Education Specialist Please refer to [facility] for RD and/or RD on-call/weekend/after hours pager

## 2021-09-17 NOTE — NC FL2 (Signed)
MEDICAID FL2 LEVEL OF CARE SCREENING TOOL     IDENTIFICATION  Patient Name: Gabrielle Navarro Birthdate: 02-11-1928 Sex: female Admission Date (Current Location): 09/15/2021  [facility] and [place] Number:  Chiropodist and Address:  [facility], [address], [place], [place] [address]      Provider Number: [id number]  Attending Physician Name and Address:  Arnetha Courser, MD  Relative Name and Phone Number:  Elease Hashimoto [phone]    Current Level of Care: Hospital Recommended Level of Care: Skilled Nursing Facility Prior Approval Number:    Date Approved/Denied:   PASRR Number: [id number]  Discharge Plan: SNF    Current Diagnoses: Patient Active Problem List   Diagnosis Date Noted   Bilateral lower extremity edema 09/16/2021   Altered mental status 09/15/2021   Hypotension 09/15/2021   Pupils of different size 09/15/2021   Protein-calorie malnutrition, moderate (HCC) 09/15/2021   DNR (do not resuscitate) 09/15/2021   Pseudogout    Anxiety and depression    Weakness 01/01/2021   Atrial fibrillation, chronic (HCC) 09/06/2019   Dementia without behavioral disturbance (HCC) 09/06/2019   HOH (hard of hearing) 09/06/2019   GERD (gastroesophageal reflux disease) 09/06/2019   Essential hypertension 09/06/2019   Hypothyroidism 09/06/2019    Orientation RESPIRATION BLADDER Height & Weight     Self, Time  Normal Incontinent, External catheter Weight: 139 lb 1.8 oz (63.1 kg) Height:     BEHAVIORAL SYMPTOMS/MOOD NEUROLOGICAL BOWEL NUTRITION STATUS      Continent Diet (see discharge summary)  AMBULATORY STATUS COMMUNICATION OF NEEDS Skin   Extensive Assist Verbally Normal                       Personal Care Assistance Level of Assistance  Bathing, Feeding, Dressing, Total care Bathing Assistance: Limited assistance Feeding assistance: Independent Dressing Assistance: Limited assistance Total Care Assistance: Maximum  assistance   Functional Limitations Info  Sight, Hearing, Speech Sight Info: Adequate Hearing Info: Adequate Speech Info: Adequate    SPECIAL CARE FACTORS FREQUENCY  PT (By licensed PT), OT (By licensed OT)     PT Frequency: min 4x weekly OT Frequency: min 4x weekly            Contractures Contractures Info: Not present    Additional Factors Info  Code Status, Allergies Code Status Info: DNR Allergies Info: Amoxicillin   Shellfish Allergy   Chocolate Flavor   Sulfa Antibiotics   Latex           Current Medications (09/17/2021):  This is the current hospital active medication list Current Facility-Administered Medications  Medication Dose Route Frequency Provider Last Rate Last Admin   0.9 %  sodium chloride infusion  250 mL Intravenous Continuous Tressie Ellis, RPH   Held at 09/15/21 1906   acetaminophen (TYLENOL) tablet 650 mg  650 mg Oral Q6H PRN Cox, Amy N, DO   650 mg at 09/15/21 1954   Or   acetaminophen (TYLENOL) suppository 650 mg  650 mg Rectal Q6H PRN Cox, Amy N, DO       apixaban (ELIQUIS) tablet 2.5 mg  2.5 mg Oral BID Cox, Amy N, DO   2.5 mg at 09/17/21 0931   docusate sodium (COLACE) capsule 100 mg  100 mg Oral BID PRN Cox, Amy N, DO       feeding supplement (ENSURE ENLIVE / ENSURE PLUS) liquid 237 mL  237 mL Oral TID BM Arnetha Courser, MD   237 mL at  09/16/21 1946   levothyroxine (SYNTHROID) tablet 25 mcg  25 mcg Oral QAC breakfast Cox, Amy N, DO   25 mcg at 09/17/21 1093   memantine (NAMENDA XR) 24 hr capsule 28 mg  28 mg Oral Daily Cox, Amy N, DO   28 mg at 09/17/21 0931   midodrine (PROAMATINE) tablet 5 mg  5 mg Oral TID WC Arnetha Courser, MD   5 mg at 09/17/21 0842   mirtazapine (REMERON) tablet 7.5 mg  7.5 mg Oral QHS Cox, Amy N, DO   7.5 mg at 09/16/21 2138   multivitamin with minerals tablet 1 tablet  1 tablet Oral Daily Arnetha Courser, MD       norepinephrine (LEVOPHED) 4mg  in (0.016 mg/mL) premix infusion  2-10 mcg/min Intravenous Titrated  , RPH   Paused at 09/16/21 2330   ondansetron (ZOFRAN) tablet 4 mg  4 mg Oral Q6H PRN Cox, Amy N, DO       Or   ondansetron (ZOFRAN) injection 4 mg  4 mg Intravenous Q6H PRN Cox, Amy N, DO       senna-docusate (Senokot-S) tablet 1 tablet  1 tablet Oral QHS PRN Cox, Amy N, DO       traZODone (DESYREL) tablet 50 mg  50 mg Oral QHS Cox, Amy N, DO   50 mg at 09/16/21 2137   traZODone (DESYREL) tablet 50 mg  50 mg Oral QHS PRN Cox, Amy N, DO       [START ON 09/18/2021] vancomycin (VANCOREADY) IVPB 1250 mg/250 mL  1,250 mg Intravenous Q36H 11/19/2021, MD       Current Outpatient Medications  Medication Sig Dispense Refill   apixaban (ELIQUIS) 2.5 MG TABS tablet Take 2.5 mg by mouth 2 (two) times daily.      carvedilol (COREG) 6.25 MG tablet Take 6.25 mg by mouth 2 (two) times daily with a meal.      furosemide (LASIX) 20 MG tablet Take 10 mg by mouth daily.     HYDROcodone-acetaminophen (NORCO/VICODIN) 5-325 MG tablet Take 1 tablet by mouth every 6 (six) hours as needed. 20 tablet 0   lactose free nutrition (BOOST) LIQD Take 237 mLs by mouth 3 (three) times daily between meals.     levothyroxine (SYNTHROID, LEVOTHROID) 25 MCG tablet Take 25 mcg by mouth daily before breakfast.      losartan (COZAAR) 25 MG tablet Take 25 mg by mouth 2 (two) times a day.     memantine (NAMENDA XR) 28 MG CP24 24 hr capsule Take 28 mg by mouth daily.     mirtazapine (REMERON) 15 MG tablet Take 7.5 mg by mouth daily.     omeprazole (PRILOSEC) 10 MG capsule Take 10 mg by mouth daily.      STOOL SOFTENER 100 MG capsule Take 100 mg by mouth 2 (two) times daily.     traZODone (DESYREL) 100 MG tablet Take 100 mg by mouth at bedtime.      acetaminophen (TYLENOL) 325 MG tablet Take 2 tablets (650 mg total) by mouth every 6 (six) hours as needed for mild pain or moderate pain (or temp > 37.5 C (99.5 F)).     LAGEVRIO 200 MG CAPS capsule Take by mouth.     mirtazapine (REMERON) 7.5 MG tablet Take 7.5 mg by  mouth at bedtime. (Patient not taking: Reported on 09/15/2021)     MUCUS RELIEF 400 MG TABS tablet Take 400 mg by mouth every 6 (six) hours as needed (cough).  ondansetron (ZOFRAN) 4 MG tablet Take 4 mg by mouth every 6 (six) hours as needed for nausea or vomiting.     ondansetron (ZOFRAN-ODT) 4 MG disintegrating tablet Take 1 tablet (4 mg total) by mouth every 6 (six) hours as needed for nausea or vomiting. 20 tablet 0   senna-docusate (SENOKOT-S) 8.6-50 MG tablet Take 1 tablet by mouth at bedtime as needed for mild constipation. (Patient not taking: Reported on 09/15/2021)       Discharge Medications: Please see discharge summary for a list of discharge medications.  Relevant Imaging Results:  Relevant Lab Results:   Additional Information SSN:[id number]  Gildardo Griffes, LCSW

## 2021-09-17 NOTE — TOC Progression Note (Addendum)
Transition of Care [facility]) - Progression Note    Patient Details  Name: Gabrielle Navarro MRN: [id number] Date of Birth: 14-Mar-1928  Transition of Care [facility]) CM/SW Contact  Gildardo Griffes, [place] Phone Number: 09/17/2021, 11:31 AM  Clinical Narrative:      Update; [facility] reports they need a letter from [facility] stating they will discontinue services prior to them agreeing to make a bed offer. They also report no weekend admission they will have to re assess Monday. CSW has informed [place] with [facility] of above for letter.    Patient's daughter called this CSW expressed concerns with patient returning to [facility] and would rather patient go to SNF at [facility] prior to her return, she expressed understanding that patient would not have hospice services.   CSW has sent referral to [facility] pending bed offer.   Tammy at [facility] has been updated.    Expected Discharge Plan: Assisted Living Barriers to Discharge: Continued Medical Work up  Expected Discharge Plan and Services Expected Discharge Plan: Assisted Living       Living arrangements for the past 2 months: Assisted Living Facility ([facility])                                       Social Determinants of Health (SDOH) Interventions    Readmission Risk Interventions     No data to display

## 2021-09-17 NOTE — Plan of Care (Signed)

## 2021-09-17 NOTE — Assessment & Plan Note (Signed)
Patient was on carvedilol, losartan and furosemide at home.  She was also taking Norco and trazodone which can all lower the blood pressure and cause altered mental status Blood pressure now within goal after weaning her off from Levophed. -Continue to monitor -Keep holding home antihypertensives

## 2021-09-17 NOTE — Assessment & Plan Note (Signed)
-   Home carvedilol 6.25 mg p.o. twice daily, losartan 25 mg daily, furosemide 20 mg daily have been held at this time due to hypotension on presentation - Per daughter at bedside patient has taken these medications via facility prior to ED presentation -Continue to hold

## 2021-09-17 NOTE — Progress Notes (Signed)
Progress Note   Patient: Gabrielle Navarro NIO:[id number] DOB: September 13, 1927 DOA: 09/15/2021     2 DOS: the patient was seen and examined on 09/17/2021   Brief hospital course: Mrs. Gabrielle Navarro is a 88 year old female with history of hypothyroid, hypertension, insomnia, atrial fibrillation on Eliquis, chronic left PCA infarct, chronic right cerebellar infarct, cervical spondylosis, who presents emergency department from [facility] facility for chief concerns of altered mental status and falls.  Initial vitals in the emergency department showed temperature 97.3, respiration rate of 18, heart rate of 71, blood pressure 103/60, SPO2 99% on room air.  Serum sodium is 139, potassium 3.9, chloride 105, bicarb 25, BUN of 25, serum creatinine of 0.88, GFR greater than 60, nonfasting blood glucose 135, WBC 7.5, hemoglobin 13.2, platelets of 214.  WBC 7.5, hemoglobin 13.2, platelets of 214.  High sensitive troponin is 8. UA was negative for leukocytes and nitrates  ED treatment: Tylenol 1 g, LR 1 L bolus.  Patient with history of advanced dementia and at baseline recognizes daughter what she was doing in the ED.  CT head and MRI brain was without any acute abnormality. Other images which include chest x-ray, DG knee and hip was also without any acute abnormality or fractures. CT spine was negative for any acute abnormality. Per daughter patient has decreased p.o. intake for many months and gradually declining.  Per chart review patient is active with hospice services with [facility].   6/29: UA does not look infected.  Blood cultures pending.  No other obvious abnormality noted on labs except mildly elevated TSH at 4.7.  BNP at 212.  COVID PCR negative. echocardiogram with normal EF and no diastolic dysfunction.  Mildly dilated right and left atrium. Patient was placed on pressors overnight due to persistent hypotension.  We will try midodrine and try weaning her off from Levophed.  6/30: 1/4 blood  cultures blood cultures with Staphylococcus species, pending further identification and susceptibility.  Patient remained afebrile and no leukocytosis, based on her persistent hypotension requiring pressor we decided to start vancomycin.  Blood pressure improved overnight and she was able to weaned off from Levophed .Repeat blood cultures ordered. Patient was on multiple medications which can lower blood pressure.  No other sign of infection. Checking a.m. cortisol.  Procalcitonin is negative. If blood cultures remain negative by tomorrow we will discontinue vancomycin   Assessment and Plan: * Altered mental status Resolved and patient is now at her baseline.  Her polypharmacy might be contributory to her significant hypotension requiring pressors.  Has been weaned off from pressors now.  Might be due to hypotension causing some brain anoxia and it improves with improvement in blood pressure with vasopressors. 1/4 blood cultures bottle with staph species, she was started on vancomycin for concern of persistent hypotension.  Less likely an infection as she would not able to tell remained afebrile, no leukocytosis, all imaging remained inconclusive or without any acute abnormality.  Procalcitonin negative. -Repeat blood cultures-and if they are negative by tomorrow we will discontinue vancomycin. -Check a.m. cortisol -She will need a good med rec as she was on multiple medications which can lower blood pressure and cause altered mental status in her age group.  Hypotension Patient was on carvedilol, losartan and furosemide at home.  She was also taking Norco and trazodone which can all lower the blood pressure and cause altered mental status Blood pressure now within goal after weaning her off from Levophed. -Continue to monitor -Keep holding home antihypertensives  Essential  hypertension - Home carvedilol 6.25 mg p.o. twice daily, losartan 25 mg daily, furosemide 20 mg daily have been held at  this time due to hypotension on presentation - Per daughter at bedside patient has taken these medications via facility prior to ED presentation -Continue to hold  Atrial fibrillation, chronic (HCC) - Resumed home Eliquis 2.5 mg p.o. twice daily - Holding home carvedilol 6.25 mg p.o. twice daily at this time due to hypotension on presentation  Hypothyroidism TSH mildly elevated at 4.770 -Continue home Synthroid. -Will need a repeat testing in 4 weeks  Weakness Patient with declining health and advanced dementia.  History of poor p.o. intake. Might be a natural life trajectory. She is currently under hospice services. -PT evaluation-recommending returning to [facility] with home health PT.  Pupils of different size Baseline on left is 4-5 mm and do not dilate Right is regular shape and size and does dilate with light. CT head and MRI was without any acute abnormality  Protein-calorie malnutrition, moderate (HCC) Estimated body mass index is 23.15 kg/m as calculated from the following:   Height as of 04/17/21: 5\' 5"  (1.651 m).   Weight as of this encounter: 63.1 kg.   -Dietitian consult  Dementia without behavioral disturbance (HCC) - Continue home memantine  Bilateral lower extremity edema Patient was taking Lasix for lower extremity edema. Mostly stays in wheelchair with hanging legs. Echocardiogram without any significant abnormality. Mildly elevated BNP at 212. Might be due to chronic venous stasis or dependent edema.  Mild hypoalbuminemia at 3.2 -Try raising legs -Compression stockings     Subjective: Patient was feeling much improved and at her baseline now.  she denies any pain.  Daughter at bedside.  Physical Exam: Vitals:   09/17/21 1230 09/17/21 1300 09/17/21 1330 09/17/21 1433  BP: (!) 148/61 (!) 161/76 127/66 (!) 150/73  Pulse: 66 88 77 88  Resp: 18 20 (!) 22 20  Temp:    98.7 F (37.1 C)  TempSrc:      SpO2: 100% 99% 98% 92%  Weight:        General.  Frail elderly lady, in no acute distress. Pulmonary.  Lungs clear bilaterally, normal respiratory effort. CV.  Regular rate and rhythm, no JVD, rub or murmur. Abdomen.  Soft, nontender, nondistended, BS positive. CNS.  Alert and oriented to self.  No focal neurologic deficit. Extremities.  No edema, no cyanosis, pulses intact and symmetrical. Psychiatry.  Appears to have mild cognitive impairment.  Data Reviewed: Prior data reviewed.  Family Communication: Discussed with daughter at bedside  Disposition: Status is: Inpatient Remains inpatient appropriate because: Severity of illness   Planned Discharge Destination: Home with Home Health  DVT prophylaxis.  Eliquis Time spent: 45 minutes  This record has been created using 09/19/21. Errors have been sought and corrected,but may not always be located. Such creation errors do not reflect on the standard of care.  Author: Conservation officer, historic buildings, MD 09/17/2021 4:09 PM  For on call review [email or web address].

## 2021-09-17 NOTE — Assessment & Plan Note (Signed)
Resolved and patient is now at her baseline.  Her polypharmacy might be contributory to her significant hypotension requiring pressors.  Has been weaned off from pressors now.  Might be due to hypotension causing some brain anoxia and it improves with improvement in blood pressure with vasopressors. 1/4 blood cultures bottle with staph species, she was started on vancomycin for concern of persistent hypotension.  Less likely an infection as she would not able to tell remained afebrile, no leukocytosis, all imaging remained inconclusive or without any acute abnormality.  Procalcitonin negative. -Repeat blood cultures-and if they are negative by tomorrow we will discontinue vancomycin. -Check a.m. cortisol -She will need a good med rec as she was on multiple medications which can lower blood pressure and cause altered mental status in her age group.

## 2021-09-17 NOTE — Evaluation (Signed)
Physical Therapy Evaluation Patient Details Name: Gabrielle Navarro MRN: [id number] DOB: June 18, 1927 Today's Date: 09/17/2021  History of Present Illness  presented to ER secondary to AMS, falls (slid from [facility]); admitted for management of AMS, generalized weakness, hypotension (requiring pressors at admission).  Imaging (head CT, c-spine, L hip, L knee, MRI) negative for acute injury/finding.  Of note, actively followed by [facility] at ALF; daughter does wish for therapy involvement with this hospitalization.  Clinical Impression  Patient resting in bed upon arrival to room; daughter at bedside to support and provide information/history.  Patient alert and oriented to self only; follows simple commands (enhanced by gestures and therapist demonstration due to [facility]).  Limited/no ability to integrate and retain new information, even within immediate session.  Does demonstrates mild grimacing with movement of L hip (imaging negative), but tolerates WBing without difficulty.  Bilat UE/LE strength and ROM grossly symmetrical and WFL; no focal weakness appreciated.  Currently requiring min/mod assist for bed mobility; mod assist +1-2 for sit/stand and standing balance with HHA from therapist.  Demonstrates heavy posterior lean, generally fearful/resistant of correction/faciltiation by therapist. Maintains 15-20 seconds prior to spontaneous sit.  Do anticipate improvement in performance with familiar caregivers, environment and spontaneous effort by patient Additional OOB/gait efforts deferred at this time, as patient non-ambulatory/WC level at baseline. Would benefit from skilled PT to address above deficits and promote optimal return to PLOF.; Recommend transition to HHPT upon discharge from acute hospitalization as goals of care dictate/allow.     Recommendations for follow up therapy are one component of a multi-disciplinary discharge planning process, led by the attending physician.  Recommendations may be  updated based on patient status, additional functional criteria and insurance authorization.  Follow Up Recommendations  (return to [facility] with HHPT as appropriate (and as goals of care dictate))      Assistance Recommended at Discharge Frequent or constant Supervision/Assistance  Patient can return home with the following  A lot of help with walking and/or transfers;A lot of help with bathing/dressing/bathroom    Equipment Recommendations    Recommendations for Other Services       Functional Status Assessment       Precautions / Restrictions Precautions Precautions: Fall Restrictions Weight Bearing Restrictions: No      Mobility  Bed Mobility Overal bed mobility: Needs Assistance Bed Mobility: Supine to Sit     Supine to sit: Min assist, Mod assist          Transfers Overall transfer level: Needs assistance Equipment used: 2 person hand held assist Transfers: Sit to/from Stand Sit to Stand: Mod assist, +2 safety/equipment           General transfer comment: Mod assist +1-2 for sit/stand and standing balance; heavy posterior lean, generally fearful/resistant of correction/faciltiation by therapist. Maintains 15-20 seconds prior to spontaneous sit.  Do anticipate improvement in performance with familiar caregivers, environment and spontaneous effort by patient    Ambulation/Gait               General Gait Details: unsafe/unable; WC level at baseline  Stairs            Wheelchair Mobility    Modified Rankin (Stroke Patients Only)       Balance Overall balance assessment: Needs assistance Sitting-balance support: No upper extremity supported, Feet supported Sitting balance-Leahy Scale: Good     Standing balance support: Bilateral upper extremity supported Standing balance-Leahy Scale: Poor  Pertinent Vitals/Pain Pain Assessment Pain Assessment: Faces Faces Pain Scale: Hurts little  more Pain Location: L hip Pain Descriptors / Indicators: Grimacing, Guarding Pain Intervention(s): Limited activity within patient's tolerance, Monitored during session, Repositioned    Home Living Family/patient expects to be discharged to:: Assisted living                 Home Equipment: Wheelchair - manual;Grab bars - toilet;Grab bars - tub/shower      Prior Function Prior Level of Function : Needs assist             Mobility Comments: WC level as primary mobility (for last six months), requiring staff assist for brief sit/stand and SPT between seating surfaces       Hand Dominance   Dominant Hand: Right    Extremity/Trunk Assessment   Upper Extremity Assessment Upper Extremity Assessment: Overall WFL for tasks assessed (grossly 4/5 throughout)    Lower Extremity Assessment Lower Extremity Assessment: Generalized weakness (grossly 4-/5 throughout)       Communication   Communication: HOH  Cognition Arousal/Alertness: Awake/alert Behavior During Therapy: WFL for tasks assessed/performed Overall Cognitive Status: History of cognitive impairments - at baseline                                 General Comments: Oriented to self only (but does not know age); follows simple commands. Demonstrates limited/no carry-over of information or reorientation.  Frequently repeats self even within immediate session (no awareness)        General Comments      Exercises Other Exercises Other Exercises: Sit/stand x3 with bilat HHA, mod assist +1-2 for safety.  Patient very fearful; unable to attain full postural extension, maintains very heavy posterior bias   Assessment/Plan    PT Assessment Patient needs continued PT services  PT Problem List Decreased strength;Decreased activity tolerance;Decreased balance;Decreased mobility;Decreased knowledge of use of DME;Decreased safety awareness;Decreased knowledge of precautions       PT Treatment  Interventions DME instruction;Functional mobility training;Therapeutic activities;Therapeutic exercise;Balance training;Patient/family education;Cognitive remediation    PT Goals (Current goals can be found in the Care Plan section)  Acute Rehab PT Goals Patient Stated Goal: per daughter, to be able to stand/walk more PT Goal Formulation: With patient/family Time For Goal Achievement: 10/01/21 Potential to Achieve Goals: Fair    Frequency Min 2X/week     Co-evaluation               AM-PAC PT "6 Clicks" Mobility  Outcome Measure Help needed turning from your back to your side while in a flat bed without using bedrails?: A Little Help needed moving from lying on your back to sitting on the side of a flat bed without using bedrails?: A Lot Help needed moving to and from a bed to a chair (including a wheelchair)?: A Lot Help needed standing up from a chair using your arms (e.g., wheelchair or bedside chair)?: A Lot Help needed to walk in hospital room?: Total Help needed climbing 3-5 steps with a railing? : Total 6 Click Score: 11    End of Session Equipment Utilized During Treatment: Gait belt Activity Tolerance: Patient tolerated treatment well Patient left: in bed;with call bell/phone within reach (sitting EOB with OT at bedside to complete evaluation) Nurse Communication: Mobility status PT Visit Diagnosis: Muscle weakness (generalized) (M62.81);Other abnormalities of gait and mobility (R26.89)    Time: 5621-3086 PT Time Calculation (min) (ACUTE ONLY):  27 min   Charges:   PT Evaluation $PT Eval Moderate Complexity: 1 Mod PT Treatments $Therapeutic Activity: 8-22 mins      Oley Lahaie H. Manson Passey, PT, DPT, NCS 09/17/21, 10:19 AM [phone]

## 2021-09-17 NOTE — Consult Note (Signed)
Pharmacy Antibiotic Note  Gabrielle Navarro is a 88 y.o. female admitted on 09/15/2021 with bacteremia.  Pharmacy has been consulted for Vancomycin dosing. Bcx growing 1 out of 4 bottles CoNS. Pt remains, afeb, procal 0.1, WBC WNL.   Plan: Pt given vancomycin 1250 mg x 1 on 6/29. Ordered vancomycin 1250 mg q36H. Predicted AUC of 511. Goal AUC 400-550. Scr 0.87, Vd 0.72, IBW. Plan to order vancomycin level after 4th or 5th dose.   Weight: 63.1 kg (139 lb 1.8 oz)  Temp (24hrs), Avg:98.5 F (36.9 C), Min:98.5 F (36.9 C), Max:98.5 F (36.9 C)  Recent Labs  Lab 09/15/21 1047 09/16/21 0614  WBC 7.5 6.8  CREATININE 0.88 0.87     Estimated Creatinine Clearance: 37.9 mL/min (by C-G formula based on SCr of 0.87 mg/dL).    Allergies  Allergen Reactions   Amoxicillin Anaphylaxis    Patient has tolerated cefdinir within the last year.  See full allergy assessment progress note from 10/9   Shellfish Allergy Anaphylaxis   Chocolate Flavor Hives   Sulfa Antibiotics Other (See Comments)   Latex Rash    Antimicrobials this admission: 09/16/21 Vancomycin >>   Dose adjustments this admission: none  Microbiology results: 6/29: BCx: 1 of 4 bottles (anaerobe). Staph species (no resistance)   Thank you for allowing pharmacy to be a part of this patient's care.  Paschal Dopp, PharmD, BCPS 09/17/2021 8:40 AM

## 2021-09-17 NOTE — Assessment & Plan Note (Signed)
-   Resumed home Eliquis 2.5 mg p.o. twice daily - Holding home carvedilol 6.25 mg p.o. twice daily at this time due to hypotension on presentation 

## 2021-09-17 NOTE — Evaluation (Signed)
Occupational Therapy Evaluation Patient Details Name: Gabrielle Navarro MRN: [id number] DOB: [birth year] Today's Date: 09/17/2021   History of Present Illness presented to ER secondary to AMS, falls (slid from [facility]); admitted for management of AMS, generalized weakness, hypotension (requiring pressors at admission).  Imaging (head CT, c-spine, L hip, L knee, MRI) negative for acute injury/finding.  Of note, actively followed by [facility] at ALF; daughter does wish for therapy involvement with this hospitalization.   Clinical Impression   Chart reviewed, pt greeted in care of PT at edge of bed. PTA pt lives at ALF with assist from hospice. Pt was SPT with supervision per daughter report to mwc as primary means of mobility. Pt presents with decreased strength, endurance, activity tolerance all affecting safe and optimal ADL completion. STS completed with MOD A +2, UB dressing with MOD A, sit>supine with supervision with HOB raised. Pt is left as received, NAD, all needs met. Recommend discharge back to [facility] with [place]. OT will continue to follow acutely.      Recommendations for follow up therapy are one component of a multi-disciplinary discharge planning process, led by the attending physician.  Recommendations may be updated based on patient status, additional functional criteria and insurance authorization.   Follow Up Recommendations  Home health OT (return to [facility] with [place] as appropriate and goals of care dictate)    Assistance Recommended at Discharge Frequent or constant Supervision/Assistance  Patient can return home with the following A lot of help with walking and/or transfers;A lot of help with bathing/dressing/bathroom    Functional Status Assessment  Patient has had a recent decline in their functional status and demonstrates the ability to make significant improvements in function in a reasonable and predictable amount of time.  Equipment Recommendations  Other  (comment) (per next venue of care)    Recommendations for Other Services       Precautions / Restrictions Precautions Precautions: Fall Restrictions Weight Bearing Restrictions: No      Mobility Bed Mobility Overal bed mobility: Needs Assistance Bed Mobility: Sit to Supine       Sit to supine: Supervision, HOB elevated        Transfers Overall transfer level: Needs assistance Equipment used: 2 person hand held assist Transfers: Sit to/from Stand Sit to Stand: Mod assist, +2 safety/equipment                  Balance Overall balance assessment: Needs assistance Sitting-balance support: No upper extremity supported, Feet supported Sitting balance-Leahy Scale: Good     Standing balance support: Bilateral upper extremity supported Standing balance-Leahy Scale: Poor                             ADL either performed or assessed with clinical judgement   ADL Overall ADL's : Needs assistance/impaired                 Upper Body Dressing : Minimal assistance;Sitting           Toileting- Clothing Manipulation and Hygiene: Maximal assistance;Sit to/from stand               Vision Patient Visual Report: No change from baseline       Perception     Praxis      Pertinent Vitals/Pain Pain Assessment Pain Assessment: Faces Faces Pain Scale: Hurts little more Pain Location: L hip Pain Descriptors / Indicators: Grimacing, Guarding Pain Intervention(s): Limited activity within patient's tolerance,  Monitored during session, Repositioned     Hand Dominance Right   Extremity/Trunk Assessment Upper Extremity Assessment Upper Extremity Assessment: Generalized weakness   Lower Extremity Assessment Lower Extremity Assessment: Generalized weakness   Cervical / Trunk Assessment Cervical / Trunk Assessment: Normal   Communication Communication Communication: HOH   Cognition Arousal/Alertness: Awake/alert Behavior During Therapy: WFL  for tasks assessed/performed Overall Cognitive Status: History of cognitive impairments - at baseline                                 General Comments: oriented to self, follows one step commands; pt appears to be at baseline cognitive level     General Comments       Exercises     Shoulder Instructions      Home Living Family/patient expects to be discharged to:: Assisted living                             Home Equipment: Wheelchair - manual;Grab bars - toilet;Grab bars - tub/shower          Prior Functioning/Environment Prior Level of Function : Needs assist             Mobility Comments: SPT to mwc with supervision, mwc as primary mode mobility ADLs Comments: pt daugther reports she prefers to complete on her own and sometimes declines help however has assist from staff        OT Problem List: Decreased strength      OT Treatment/Interventions: Self-care/ADL training;DME and/or AE instruction;Therapeutic activities;Balance training    OT Goals(Current goals can be found in the care plan section) Acute Rehab OT Goals Patient Stated Goal: get stronger OT Goal Formulation: With family Time For Goal Achievement: 10/01/21 Potential to Achieve Goals: Good ADL Goals Pt Will Perform Grooming: with set-up;sitting Pt Will Transfer to Toilet: with min assist;stand pivot transfer  OT Frequency: Min 2X/week    Co-evaluation              AM-PAC OT "6 Clicks" Daily Activity     Outcome Measure Help from another person eating meals?: A Little Help from another person taking care of personal grooming?: A Little Help from another person toileting, which includes using toliet, bedpan, or urinal?: A Lot Help from another person bathing (including washing, rinsing, drying)?: A Lot Help from another person to put on and taking off regular upper body clothing?: A Little   6 Click Score: 13   End of Session Equipment Utilized During Treatment:  Gait belt  Activity Tolerance: Patient tolerated treatment well Patient left: in bed;with call bell/phone within reach;with bed alarm set;with family/visitor present  OT Visit Diagnosis: Unsteadiness on feet (R26.81);Muscle weakness (generalized) (M62.81)                Time: 4492-0100 OT Time Calculation (min): 12 min Charges:  OT General Charges $OT Visit: 1 Visit OT Evaluation $OT Eval Low Complexity: 1 Low  Shanon Payor, OTD OTR/L  09/17/21, 1:26 PM

## 2021-09-17 NOTE — Progress Notes (Signed)
[facility]) hospitalized hospice patient visit   [name] is a current [facility] patient with a terminal diagnosis of Abnormal weight loss. She resides in an ALF and began experiencing altered mental status with a fall on the night prior to coming to ED. [facility] was notified after patient was sent to ED. She is currently admitted for the AMS pending further work up. Per Dr. Jewel Baize with [facility] this is a related hospital admission.    Visited with patient & PCG/Patricia. Patient is but is easily aroused with light touch but is very HOH; however, she is pleasantly confused. Mardene Celeste reports that as of now, the plan remains to treat the treatable. However, she is concerned with patients' weakness and her goal is to seek SNF for rehab services at [facility] if patient does not return to baseline. Mardene Celeste reports that she is aware that [facility] cannot provide care during stay at [facility] but would like for services to be reinstated once she returns to ALF. A bed has not been offered for [facility]. Mardene Celeste reports that if patient returns to baseline before D/C, that she is fine with returning to ALF with [facility] to continue following. Mardene Celeste voices appreciation for care received by [facility] staff and is aware that HLT will continue to follow through admission. Mardene Celeste reports that MD has voiced possible infection, but location/cause is unknown. No questions/concerns reported at this time.    Patient is inpatient appropriate due to need for IV and continued monitoring.     Vital Signs: Vitals:    09/17/21 1230 09/17/21 1300 09/17/21 1330 09/17/21 1433  BP: (!) 148/61 (!) 161/76 127/66 (!) 150/73  Pulse: 66 88 77 88  Resp: 18 20 (!) 22 20  Temp:       98.7 F (37.1 C)  TempSrc:          SpO2: 100% 99% 98% 92%   Intake/Output: 200.3/650  Abnormal labs: No lab draws  Diagnostics: No new diagnostics  IV/PRN Meds: LR 1Liter x2, NS _0  x1Liter, Levophed  31mg/m, Tylenol 6521mpo x1, Midodrine 1040m1  Problem List Altered mental status - Etiology work-up in progress - Check blood cultures x2, TSH, ammonia level, B12, procalcitonin for baseline evaluation - Given daughter's endorsement of bilateral lower extremity swelling over the last month, I have ordered BNP and complete echo to assess for heart function - Admit to stepdown, observation  Pupils of different size Baseline on left is 4-5 mm and do not dilate Right is regular shape and size and does dilate with light  Hypotension - Etiology work-up in progress, query hypovolemia secondary to poor p.o. intake - Status post LR 1 L bolus per EDP - I ordered additional LR 1 L bolus - We will order midodrine once as needed for MAP less than 65 after completion of second LR bolus - Admit to stepdown, inpatient  Essential hypertension - Home carvedilol 6.25 mg p.o. twice daily, losartan 25 mg daily, furosemide 20 mg daily have been held at this time due to hypotension on presentation - Per daughter at bedside patient has taken these medications via facility prior to ED presentation - AM team to resume when appropriate  Atrial fibrillation, chronic (HCC) - Resumed home Eliquis 2.5 mg p.o. twice daily - Holding home carvedilol 6.25 mg p.o. twice daily at this time due to hypotension on presentation   Discharge Planning: Please refer to conversation above.   Family Contact: Met with daughter/Patricia at bedside  IDT: Updated  Goals of care: Per daughter to try and treat the treatable and for patient to return to baseline. Family likely to DC to SNF for rehab if patient does not improve during hospitalization. Family is aware of hospice revocation if patient pursues a SNF.   [facility] information and contact numbers given to family & above information shared with TOC.   Please call with any questions/concerns.    Thank you for the opportunity to participate in this patient's care.    Daphene Calamity, MSW [facility] Liaison  [phone]

## 2021-09-18 ENCOUNTER — Inpatient Hospital Stay

## 2021-09-18 DIAGNOSIS — R4182 Altered mental status, unspecified: Secondary | ICD-10-CM | POA: Diagnosis not present

## 2021-09-18 LAB — CBC
HCT: 37.2 % (ref 36.0–46.0)
Hemoglobin: 12 g/dL (ref 12.0–15.0)
MCH: 30.5 pg (ref 26.0–34.0)
MCHC: 32.3 g/dL (ref 30.0–36.0)
MCV: 94.7 fL (ref 80.0–100.0)
Platelets: 168 10*3/uL (ref 150–400)
RBC: 3.93 MIL/uL (ref 3.87–5.11)
RDW: 13.2 % (ref 11.5–15.5)
WBC: 5.9 10*3/uL (ref 4.0–10.5)
nRBC: 0 % (ref 0.0–0.2)

## 2021-09-18 LAB — CULTURE, BLOOD (ROUTINE X 2): Special Requests: ADEQUATE

## 2021-09-18 LAB — CORTISOL-AM, BLOOD: Cortisol - AM: 15.4 ug/dL (ref 6.7–22.6)

## 2021-09-18 MED ORDER — ACETAMINOPHEN 650 MG RE SUPP: 650.0000 mg | Freq: Four times a day (QID) | RECTAL | Status: AC | PRN

## 2021-09-18 MED ORDER — ACETAMINOPHEN 325 MG PO TABS
650.0000 mg | ORAL_TABLET | Freq: Four times a day (QID) | ORAL | Status: AC | PRN
  Administered 2021-09-18 – 2021-09-20 (×4): 650 mg via ORAL
  Filled 2021-09-18 (×4): qty 2

## 2021-09-18 NOTE — Progress Notes (Signed)
[facility]) Hospital Liaison Note  Met with patient, daughter Mardene Celeste and Dr. Reesa Chew at bedside. Per Dr. Reesa Chew, patient is medically stable for discharge. Per daughter facility is unable to assess if patient can return to her ALF before Wednesday and [facility] is unable to assess for rehab before Wednesday as well.  Patient does not meet criteria to support General Inpatient level of care per Hospice Medicare Guidelines. Mardene Celeste has elected to revoke the Hospice Medicare Benefit to allow Medicare to pay for hospitalization and possible rehab.  Revocation completed.  [facility] liaisons will follow loosely for discharge disposition.  Please feel free to reach out for any hospice related questions.  Thank you, Margaretmary Eddy, BSN, RN [facility] Liaison [phone]

## 2021-09-18 NOTE — Assessment & Plan Note (Signed)
Resolved and patient is now at her baseline.  Her polypharmacy might be contributory to her significant hypotension requiring pressors.  Has been weaned off from pressors now.  Might be due to hypotension causing some brain anoxia and it improves with improvement in blood pressure with vasopressors. 1/4 blood cultures bottle with staph species, she was started on vancomycin for concern of persistent hypotension.  Less likely an infection as she would not able to tell remained afebrile, no leukocytosis, all imaging remained inconclusive or without any acute abnormality.  Procalcitonin negative. -Repeat blood cultures-negative in 24 hours but patient had 1 episode of low-grade fever overnight. -Check a.m. cortisol-within normal limit -Continue vancomycin for now -She will need a good med rec as she was on multiple medications which can lower blood pressure and cause altered mental status in her age group.

## 2021-09-18 NOTE — Progress Notes (Signed)
Progress Note   Patient: Gabrielle Navarro NWG:[id number] DOB: 11-13-1927 DOA: 09/15/2021     3 DOS: the patient was seen and examined on 09/18/2021   Brief hospital course: Mrs. Lanay Zinda is a 88 year old female with history of hypothyroid, hypertension, insomnia, atrial fibrillation on Eliquis, chronic left PCA infarct, chronic right cerebellar infarct, cervical spondylosis, who presents emergency department from [facility] facility for chief concerns of altered mental status and falls.  Initial vitals in the emergency department showed temperature 97.3, respiration rate of 18, heart rate of 71, blood pressure 103/60, SPO2 99% on room air.  Serum sodium is 139, potassium 3.9, chloride 105, bicarb 25, BUN of 25, serum creatinine of 0.88, GFR greater than 60, nonfasting blood glucose 135, WBC 7.5, hemoglobin 13.2, platelets of 214.  WBC 7.5, hemoglobin 13.2, platelets of 214.  High sensitive troponin is 8. UA was negative for leukocytes and nitrates  ED treatment: Tylenol 1 g, LR 1 L bolus.  Patient with history of advanced dementia and at baseline recognizes daughter what she was doing in the ED.  CT head and MRI brain was without any acute abnormality. Other images which include chest x-ray, DG knee and hip was also without any acute abnormality or fractures. CT spine was negative for any acute abnormality. Per daughter patient has decreased p.o. intake for many months and gradually declining.  Per chart review patient is active with hospice services with [facility].   6/29: UA does not look infected.  Blood cultures pending.  No other obvious abnormality noted on labs except mildly elevated TSH at 4.7.  BNP at 212.  COVID PCR negative. echocardiogram with normal EF and no diastolic dysfunction.  Mildly dilated right and left atrium. Patient was placed on pressors overnight due to persistent hypotension.  We will try midodrine and try weaning her off from Levophed.  6/30: 1/4 blood cultures  blood cultures with Staphylococcus species, pending further identification and susceptibility.  Patient remained afebrile and no leukocytosis, based on her persistent hypotension requiring pressor we decided to start vancomycin.  Blood pressure improved overnight and she was able to weaned off from Levophed .Repeat blood cultures ordered. Patient was on multiple medications which can lower blood pressure.  No other sign of infection. Checking a.m. cortisol.  Procalcitonin is negative. If blood cultures remain negative by tomorrow we will discontinue vancomycin.  7/1: Repeat blood cultures remain negative.  Patient had low-grade fever overnight.  Otherwise at baseline.  Apparently cannot return to her facility until they will evaluate her for eligibility. Her facility cannot evaluate her until Wednesday.   Assessment and Plan: * Altered mental status Resolved and patient is now at her baseline.  Her polypharmacy might be contributory to her significant hypotension requiring pressors.  Has been weaned off from pressors now.  Might be due to hypotension causing some brain anoxia and it improves with improvement in blood pressure with vasopressors. 1/4 blood cultures bottle with staph species, she was started on vancomycin for concern of persistent hypotension.  Less likely an infection as she would not able to tell remained afebrile, no leukocytosis, all imaging remained inconclusive or without any acute abnormality.  Procalcitonin negative. -Repeat blood cultures-negative in 24 hours but patient had 1 episode of low-grade fever overnight. -Check a.m. cortisol-within normal limit -Continue vancomycin for now -She will need a good med rec as she was on multiple medications which can lower blood pressure and cause altered mental status in her age group.  Hypotension Patient was on  carvedilol, losartan and furosemide at home.  She was also taking Norco and trazodone which can all lower the blood  pressure and cause altered mental status Blood pressure now within goal after weaning her off from Levophed. -Continue to monitor -Keep holding home antihypertensives  Essential hypertension - Home carvedilol 6.25 mg p.o. twice daily, losartan 25 mg daily, furosemide 20 mg daily have been held at this time due to hypotension on presentation - Per daughter at bedside patient has taken these medications via facility prior to ED presentation -Continue to hold  Atrial fibrillation, chronic (HCC) - Resumed home Eliquis 2.5 mg p.o. twice daily - Holding home carvedilol 6.25 mg p.o. twice daily at this time due to hypotension on presentation  Hypothyroidism TSH mildly elevated at 4.770 -Continue home Synthroid. -Will need a repeat testing in 4 weeks  Weakness Patient with declining health and advanced dementia.  History of poor p.o. intake. Might be a natural life trajectory. She is currently under hospice services. -PT evaluation-recommending returning to [facility] with home health PT.  Pupils of different size Baseline on left is 4-5 mm and do not dilate Right is regular shape and size and does dilate with light. CT head and MRI was without any acute abnormality  Protein-calorie malnutrition, moderate (HCC) Estimated body mass index is 23.15 kg/m as calculated from the following:   Height as of 04/17/21: 5\' 5"  (1.651 m).   Weight as of this encounter: 63.1 kg.   -Dietitian consult  Dementia without behavioral disturbance (HCC) - Continue home memantine  Bilateral lower extremity edema Patient was taking Lasix for lower extremity edema. Mostly stays in wheelchair with hanging legs. Echocardiogram without any significant abnormality. Mildly elevated BNP at 212. Might be due to chronic venous stasis or dependent edema.  Mild hypoalbuminemia at 3.2 -Try raising legs -Compression stockings     Subjective: Patient was seen and examined today.  No new complaints.  Daughter at  bedside.  Physical Exam: Vitals:   09/18/21 0951 09/18/21 1231 09/18/21 1635 09/18/21 1646  BP: (!) 146/76 (!) 118/51 (!) 146/71 138/63  Pulse: 78 80 97 94  Resp: 18 18 18    Temp:  99 F (37.2 C) 99.8 F (37.7 C)   TempSrc:      SpO2: 99% 96% 92% 94%  Weight:      Height:       General.  Pleasant elderly lady, in no acute distress. Pulmonary.  Lungs clear bilaterally, normal respiratory effort. CV.  Regular rate and rhythm, no JVD, rub or murmur. Abdomen.  Soft, nontender, nondistended, BS positive. CNS.  Alert and oriented .  No focal neurologic deficit. Extremities.  No edema, no cyanosis, pulses intact and symmetrical.  Data Reviewed: Prior data reviewed  Family Communication: Discussed with daughter at bedside  Disposition: Status is: Inpatient Remains inpatient appropriate because: Assisted living is refusing to take her back until Wednesday due to holiday.  Medically stable   Planned Discharge Destination: Home with Home Health  Time spent: 43 minutes  This record has been created using . Errors have been sought and corrected,but may not always be located. Such creation errors do not reflect on the standard of care.  Author: Wednesday, MD 09/18/2021 5:33 PM  For on call review [email or web address].

## 2021-09-19 DIAGNOSIS — R4182 Altered mental status, unspecified: Secondary | ICD-10-CM | POA: Diagnosis not present

## 2021-09-19 LAB — CREATININE, SERUM
Creatinine, Ser: 0.7 mg/dL (ref 0.44–1.00)
GFR, Estimated: >60 mL/min (ref >60)

## 2021-09-19 MED ORDER — CEPHALEXIN 500 MG PO CAPS
500.0000 mg | ORAL_CAPSULE | Freq: Three times a day (TID) | ORAL | Status: DC
Start: 2021-09-19 — End: 2021-09-22
  Administered 2021-09-19 – 2021-09-22 (×10): 500 mg via ORAL
  Filled 2021-09-19 (×10): qty 1

## 2021-09-19 MED ORDER — LOSARTAN POTASSIUM 25 MG PO TABS
25.0000 mg | ORAL_TABLET | Freq: Every day | ORAL | Status: DC
  Administered 2021-09-19 – 2021-09-22 (×4): 25 mg via ORAL
  Filled 2021-09-19 (×4): qty 1

## 2021-09-19 MED ORDER — CARVEDILOL 6.25 MG PO TABS
6.2500 mg | ORAL_TABLET | Freq: Two times a day (BID) | ORAL | Status: DC
  Administered 2021-09-20 – 2021-09-22 (×6): 6.25 mg via ORAL
  Filled 2021-09-19 (×6): qty 1

## 2021-09-19 NOTE — Assessment & Plan Note (Signed)
Patient was on carvedilol, losartan and furosemide at home.  She was also taking Norco and trazodone which can all lower the blood pressure and cause altered mental status Blood pressure now started trending up -Continue to monitor -Restart home carvedilol and losartan

## 2021-09-19 NOTE — Assessment & Plan Note (Signed)
Resolved and patient is now at her baseline.  Her polypharmacy might be contributory to her significant hypotension requiring pressors.  Has been weaned off from pressors now.  Might be due to hypotension causing some brain anoxia and it improves with improvement in blood pressure with vasopressors. 1/4 blood cultures bottle with staph cohnii, she was started on vancomycin for concern of persistent hypotension.  Less likely an infection as she would not able to tell remained afebrile, no leukocytosis, all imaging remained inconclusive or without any acute abnormality.  Procalcitonin negative. -Repeat blood cultures-negative in 24 hours but patient had 1 episode of low-grade fever overnight. -Check a.m. cortisol-within normal limit -Switch vancomycin with 3 more days of Keflex -She will need a good med rec as she was on multiple medications which can lower blood pressure and cause altered mental status in her age group.

## 2021-09-19 NOTE — Assessment & Plan Note (Signed)
Blood patient started trending up.  Home carvedilol 6.25 mg p.o. twice daily, losartan 25 mg daily, furosemide 20 mg daily have been held at this time due to hypotension on presentation -Restart home carvedilol and losartan -Keep holding Lasix

## 2021-09-19 NOTE — Progress Notes (Signed)
Progress Note   Patient: Gabrielle Navarro NWG:[id number] DOB: 06/22/1927 DOA: 09/15/2021     4 DOS: the patient was seen and examined on 09/19/2021   Brief hospital course: Mrs. Quinteria Chisum is a 88 year old female with history of hypothyroid, hypertension, insomnia, atrial fibrillation on Eliquis, chronic left PCA infarct, chronic right cerebellar infarct, cervical spondylosis, who presents emergency department from [facility] facility for chief concerns of altered mental status and falls.  Initial vitals in the emergency department showed temperature 97.3, respiration rate of 18, heart rate of 71, blood pressure 103/60, SPO2 99% on room air.  Serum sodium is 139, potassium 3.9, chloride 105, bicarb 25, BUN of 25, serum creatinine of 0.88, GFR greater than 60, nonfasting blood glucose 135, WBC 7.5, hemoglobin 13.2, platelets of 214.  WBC 7.5, hemoglobin 13.2, platelets of 214.  High sensitive troponin is 8. UA was negative for leukocytes and nitrates  ED treatment: Tylenol 1 g, LR 1 L bolus.  Patient with history of advanced dementia and at baseline recognizes daughter what she was doing in the ED.  CT head and MRI brain was without any acute abnormality. Other images which include chest x-ray, DG knee and hip was also without any acute abnormality or fractures. CT spine was negative for any acute abnormality. Per daughter patient has decreased p.o. intake for many months and gradually declining.  Per chart review patient is active with hospice services with [facility].   6/29: UA does not look infected.  Blood cultures pending.  No other obvious abnormality noted on labs except mildly elevated TSH at 4.7.  BNP at 212.  COVID PCR negative. echocardiogram with normal EF and no diastolic dysfunction.  Mildly dilated right and left atrium. Patient was placed on pressors overnight due to persistent hypotension.  We will try midodrine and try weaning her off from Levophed.  6/30: 1/4 blood cultures  blood cultures with Staphylococcus species, pending further identification and susceptibility.  Patient remained afebrile and no leukocytosis, based on her persistent hypotension requiring pressor we decided to start vancomycin.  Blood pressure improved overnight and she was able to weaned off from Levophed .Repeat blood cultures ordered. Patient was on multiple medications which can lower blood pressure.  No other sign of infection. Checking a.m. cortisol.  Procalcitonin is negative. If blood cultures remain negative by tomorrow we will discontinue vancomycin.  7/1: Repeat blood cultures remain negative.  Patient had low-grade fever overnight.  Otherwise at baseline.  Apparently cannot return to her facility until they will evaluate her for eligibility. Her facility cannot evaluate her until Wednesday.  7/2: Remained stable and afebrile.  First blood culture 1/4 bottles with staph Cohnii, not much significant and she received 2 days of vancomycin.  We will give her 3 more days of Keflex as she did had 1 episode of fever.  Waiting for facility evaluation so she can go back to her assisted living.   Assessment and Plan: * Altered mental status Resolved and patient is now at her baseline.  Her polypharmacy might be contributory to her significant hypotension requiring pressors.  Has been weaned off from pressors now.  Might be due to hypotension causing some brain anoxia and it improves with improvement in blood pressure with vasopressors. 1/4 blood cultures bottle with staph cohnii, she was started on vancomycin for concern of persistent hypotension.  Less likely an infection as she would not able to tell remained afebrile, no leukocytosis, all imaging remained inconclusive or without any acute abnormality.  Procalcitonin  negative. -Repeat blood cultures-negative in 24 hours but patient had 1 episode of low-grade fever overnight. -Check a.m. cortisol-within normal limit -Switch vancomycin with 3  more days of Keflex -She will need a good med rec as she was on multiple medications which can lower blood pressure and cause altered mental status in her age group.  Hypotension Patient was on carvedilol, losartan and furosemide at home.  She was also taking Norco and trazodone which can all lower the blood pressure and cause altered mental status Blood pressure now started trending up -Continue to monitor -Restart home carvedilol and losartan   Essential hypertension Blood patient started trending up.  Home carvedilol 6.25 mg p.o. twice daily, losartan 25 mg daily, furosemide 20 mg daily have been held at this time due to hypotension on presentation -Restart home carvedilol and losartan -Keep holding Lasix   Atrial fibrillation, chronic (HCC) - Resumed home Eliquis 2.5 mg p.o. twice daily - Restarting home carvedilol  Hypothyroidism TSH mildly elevated at 4.770 -Continue home Synthroid. -Will need a repeat testing in 4 weeks  Weakness Patient with declining health and advanced dementia.  History of poor p.o. intake. Might be a natural life trajectory. She is currently under hospice services. -PT evaluation-recommending returning to [facility] with home health PT.  Pupils of different size Baseline on left is 4-5 mm and do not dilate Right is regular shape and size and does dilate with light. CT head and MRI was without any acute abnormality  Protein-calorie malnutrition, moderate (HCC) Estimated body mass index is 23.15 kg/m as calculated from the following:   Height as of 04/17/21: 5\' 5"  (1.651 m).   Weight as of this encounter: 63.1 kg.   -Dietitian consult  Dementia without behavioral disturbance (HCC) - Continue home memantine  Bilateral lower extremity edema Patient was taking Lasix for lower extremity edema. Mostly stays in wheelchair with hanging legs. Echocardiogram without any significant abnormality. Mildly elevated BNP at 212. Might be due to chronic  venous stasis or dependent edema.  Mild hypoalbuminemia at 3.2 -Try raising legs -Compression stockings     Subjective: Patient was seen and examined today.  No new complaints.  Physical Exam: Vitals:   09/19/21 0023 09/19/21 0626 09/19/21 0833 09/19/21 1722  BP: 131/64 128/69 (!) 154/69 (!) 155/59  Pulse: 84 85 87 100  Resp: 18 18 18 18   Temp: 97.8 F (36.6 C) 98 F (36.7 C) 99 F (37.2 C) 98 F (36.7 C)  TempSrc:      SpO2: 95% 96% 95% 99%  Weight:      Height:       General.  Hard of hearing but pleasant elderly lady, in no acute distress. Pulmonary.  Lungs clear bilaterally, normal respiratory effort. CV.  Regular rate and rhythm, no JVD, rub or murmur. Abdomen.  Soft, nontender, nondistended, BS positive. CNS.  Alert and oriented to self.  No focal neurologic deficit. Extremities.  No edema, no cyanosis, pulses intact and symmetrical.  Data Reviewed: Prior data reviewed  Family Communication:   Disposition: Status is: Inpatient Remains inpatient appropriate because: Medically stable, facility want to evaluate before taking her back which is being delayed due to holidays.   Planned Discharge Destination: Home  Time spent: 40 minutes  This record has been created using 11/20/21. Errors have been sought and corrected,but may not always be located. Such creation errors do not reflect on the standard of care.  Author: , MD 09/19/2021 6:04 PM  For on  call review [email or web address].

## 2021-09-19 NOTE — Assessment & Plan Note (Signed)
-   Resumed home Eliquis 2.5 mg p.o. twice daily - Restarting home carvedilol

## 2021-09-20 DIAGNOSIS — R4182 Altered mental status, unspecified: Secondary | ICD-10-CM | POA: Diagnosis not present

## 2021-09-20 LAB — CULTURE, BLOOD (ROUTINE X 2)
Culture: NO GROWTH
Special Requests: ADEQUATE

## 2021-09-20 NOTE — Progress Notes (Signed)
Physical Therapy Treatment Patient Details Name: Gabrielle Navarro MRN: [id number] DOB: [birth year] Today's Date: 09/20/2021   History of Present Illness presented to ER secondary to AMS, falls (slid from [facility]); admitted for management of AMS, generalized weakness, hypotension (requiring pressors at admission).  Imaging (head CT, c-spine, L hip, L knee, MRI) negative for acute injury/finding.  Of note, actively followed by [facility] at ALF; daughter does wish for therapy involvement with this hospitalization.    PT Comments    Patient alert, follows one step commands but does get upset/anxious with transfers/mobility. She was able to sit <> stand twice from bed and from recliner, minA but able to hold the position for several minutes due to bouts of incontinence. maxA for all pericare and clean up. Ultimately maxA to stand pivot to bed surface, and then back to recliner. Pt anxious, and unable to initiate step or stand pivot at this time. Recommendation updated to SNF due to change from PLOF and current level of assistance needed, CSW and RN notified and aware.     Recommendations for follow up therapy are one component of a multi-disciplinary discharge planning process, led by the attending physician.  Recommendations may be updated based on patient status, additional functional criteria and insurance authorization.  Follow Up Recommendations  Skilled nursing-short term rehab (<3 hours/day) Can patient physically be transported by private vehicle: No   Assistance Recommended at Discharge Frequent or constant Supervision/Assistance  Patient can return home with the following Two people to help with walking and/or transfers;Two people to help with bathing/dressing/bathroom;Direct supervision/assist for medications management;Assist for transportation;Direct supervision/assist for financial management;Assistance with cooking/housework;Help with stairs or ramp for entrance   Equipment  Recommendations  Other (comment) (TBD)    Recommendations for Other Services       Precautions / Restrictions Precautions Precautions: Fall Restrictions Weight Bearing Restrictions: No     Mobility  Bed Mobility               General bed mobility comments: pt OOB throughout session    Transfers Overall transfer level: Needs assistance Equipment used: 1 person hand held assist Transfers: Sit to/from Stand, Bed to chair/wheelchair/BSC Sit to Stand: Min assist Stand pivot transfers: Max assist         General transfer comment: pt able to sit <> stand with recliner with minA and maintain squatting position. cannot transfer/stand pivot without max-totalA    Ambulation/Gait               General Gait Details: unsafe/unable; WC level at baseline   Optometrist    Modified Rankin (Stroke Patients Only)       Balance Overall balance assessment: Needs assistance Sitting-balance support: No upper extremity supported, Feet supported Sitting balance-Leahy Scale: Good Sitting balance - Comments: able to don/doff sock on RLE   Standing balance support: Bilateral upper extremity supported Standing balance-Leahy Scale: Poor Standing balance comment: reliant on BUE support; unable to maintain anything but static standing. max-totalA stand pivot                            Cognition Arousal/Alertness: Awake/alert Behavior During Therapy: WFL for tasks assessed/performed Overall Cognitive Status: History of cognitive impairments - at baseline  General Comments: oriented to self, follows one step commands; pt appears to be at baseline cognitive level        Exercises      General Comments        Pertinent Vitals/Pain Pain Assessment Pain Assessment: Faces Faces Pain Scale: No hurt    Home Living                          Prior Function             PT Goals (current goals can now be found in the care plan section) Progress towards PT goals: Progressing toward goals    Frequency    Min 2X/week      PT Plan Discharge plan needs to be updated    Co-evaluation              AM-PAC PT "6 Clicks" Mobility   Outcome Measure  Help needed turning from your back to your side while in a flat bed without using bedrails?: A Lot Help needed moving from lying on your back to sitting on the side of a flat bed without using bedrails?: A Lot Help needed moving to and from a bed to a chair (including a wheelchair)?: A Lot Help needed standing up from a chair using your arms (e.g., wheelchair or bedside chair)?: A Lot Help needed to walk in hospital room?: Total Help needed climbing 3-5 steps with a railing? : Total 6 Click Score: 10    End of Session Equipment Utilized During Treatment: Gait belt Activity Tolerance: Patient tolerated treatment well Patient left: with chair alarm set;with call bell/phone within reach;in chair Nurse Communication: Mobility status PT Visit Diagnosis: Muscle weakness (generalized) (M62.81);Other abnormalities of gait and mobility (R26.89)     Time: 9242-6834 PT Time Calculation (min) (ACUTE ONLY): 23 min  Charges:  $Therapeutic Activity: 23-37 mins                     Olga Coaster PT, DPT 10:54 AM,09/20/21

## 2021-09-20 NOTE — Assessment & Plan Note (Signed)
Resolved and patient is now at her baseline.  Her polypharmacy might be contributory to her significant hypotension requiring pressors.  Has been weaned off from pressors now.  Might be due to hypotension causing some brain anoxia and it improves with improvement in blood pressure with vasopressors. 1/4 blood cultures bottle with staph cohnii, she was started on vancomycin for concern of persistent hypotension.  Less likely an infection as she would not able to tell remained afebrile, no leukocytosis, all imaging remained inconclusive or without any acute abnormality.  Procalcitonin negative. -Repeat blood cultures-negative in 24 hours but patient had 1 episode of low-grade fever overnight. -Check a.m. cortisol-within normal limit -Switch vancomycin with 3 more days of Keflex -She will need a good med rec as she was on multiple medications which can lower blood pressure and cause altered mental status in her age group. 

## 2021-09-20 NOTE — TOC Progression Note (Addendum)
Transition of Care [facility]) - Progression Note    Patient Details  Name: Gabrielle Navarro MRN: [id number] Date of Birth: May 22, 1927  [facility] CM/SW Contact  Gildardo Griffes, [place] Phone Number: 09/20/2021, 9:18 AM  Clinical Narrative:     CSW spoke with Verlon Au with admissions at [facility] who reports their business office is closed today and will re open tomorrow, in which they can confirm hospice revocation was completed and patient can come to [facility] 7/4. She reports she will call me tomorrow with their decision. Patient's daughter updated.   Expected Discharge Plan: Assisted Living Barriers to Discharge: Continued Medical Work up  Expected Discharge Plan and Services Expected Discharge Plan: Assisted Living       Living arrangements for the past 2 months: Assisted Living Facility ([facility])                                       Social Determinants of Health (SDOH) Interventions    Readmission Risk Interventions     No data to display

## 2021-09-20 NOTE — Progress Notes (Signed)
Nutrition Follow-up  DOCUMENTATION CODES:   Severe malnutrition in context of chronic illness  INTERVENTION:   -Continue Ensure Enlive po TID, each supplement provides 350 kcal and 20 grams of protein -Continue MVI with minerals daily  NUTRITION DIAGNOSIS:   Severe Malnutrition related to chronic illness (dementia) as evidenced by moderate fat depletion, severe fat depletion, moderate muscle depletion, severe muscle depletion.  Ongoing  GOAL:   Patient will meet greater than or equal to 90% of their needs  Progressing   MONITOR:   PO intake, Supplement acceptance  REASON FOR ASSESSMENT:   Malnutrition Screening Tool, Consult Assessment of nutrition requirement/status  ASSESSMENT:   Pt with history of hypothyroid, hypertension, insomnia, atrial fibrillation on Eliquis, chronic left PCA infarct, chronic right cerebellar infarct, cervical spondylosis, who presents from [facility] facility for chief concerns of altered mental status and falls.  Reviewed I/O's: -500 ml x 24 hours and -2.7 L since admission  Pt remains on a dysphagia 3 diet. Noted meal completions 100%.   Pt sitting in recliner chair at time of visit. Pt very HOH and tangential at time of visit. She reports she likes drinking her Ensure supplements.   Reviewed wt hx; no wt loss noted over the past 5 months.   Per TOC notes, plan to d/c to [facility] tomorrow.   Medications reviewed and include remeron.   Labs reviewed: CBGS: 106.   NUTRITION - FOCUSED PHYSICAL EXAM:  Flowsheet Row Most Recent Value  Orbital Region Moderate depletion  Upper Arm Region Severe depletion  Thoracic and Lumbar Region Moderate depletion  Buccal Region Severe depletion  Temple Region Severe depletion  Clavicle Bone Region Moderate depletion  Clavicle and Acromion Bone Region Moderate depletion  Scapular Bone Region Moderate depletion  Dorsal Hand Severe depletion  Patellar Region Moderate depletion  Anterior  Thigh Region Moderate depletion  Posterior Calf Region Moderate depletion  Edema (RD Assessment) Mild  Hair Reviewed  Eyes Reviewed  Mouth Reviewed  Skin Reviewed  Nails Reviewed       Diet Order:   Diet Order             DIET DYS 3 Room service appropriate? Yes; Fluid consistency: Thin  Diet effective now                   EDUCATION NEEDS:   No education needs have been identified at this time  Skin:  Skin Assessment: Reviewed RN Assessment  Last BM:  09/20/21  Height:   Ht Readings from Last 1 Encounters:  09/17/21 5\' 5"  (1.651 m)    Weight:   Wt Readings from Last 1 Encounters:  09/15/21 63.1 kg    Ideal Body Weight:  56.8 kg  BMI:  Body mass index is 23.15 kg/m.  Estimated Nutritional Needs:   Kcal:  1700-1900  Protein:  80-95 grams  Fluid:  > 1.7 L    09/17/21, RD, LDN, CDCES Registered Dietitian II Certified Diabetes Care and Education Specialist Please refer to [facility] for RD and/or RD on-call/weekend/after hours pager

## 2021-09-20 NOTE — Progress Notes (Signed)
PT Cancellation Note  Patient Details Name: Gabrielle Navarro MRN: [id number] DOB: May 30, 1927   Cancelled Treatment:    Reason Eval/Treat Not Completed: Other (comment). Pt up in chair starting breakfast. PT to re-attempt as able.    Olga Coaster PT, DPT 8:48 AM,09/20/21

## 2021-09-20 NOTE — Progress Notes (Signed)
Progress Note   Patient: Gabrielle Navarro QMG:[id number] DOB: 1927/05/25 DOA: 09/15/2021     5 DOS: the patient was seen and examined on 09/20/2021   Brief hospital course: Gabrielle Navarro is a 88 year old female with history of hypothyroid, hypertension, insomnia, atrial fibrillation on Eliquis, chronic left PCA infarct, chronic right cerebellar infarct, cervical spondylosis, who presents emergency department from [facility] facility for chief concerns of altered mental status and falls.  Initial vitals in the emergency department showed temperature 97.3, respiration rate of 18, heart rate of 71, blood pressure 103/60, SPO2 99% on room air.  Serum sodium is 139, potassium 3.9, chloride 105, bicarb 25, BUN of 25, serum creatinine of 0.88, GFR greater than 60, nonfasting blood glucose 135, WBC 7.5, hemoglobin 13.2, platelets of 214.  WBC 7.5, hemoglobin 13.2, platelets of 214.  High sensitive troponin is 8. UA was negative for leukocytes and nitrates  ED treatment: Tylenol 1 g, LR 1 L bolus.  Patient with history of advanced dementia and at baseline recognizes daughter what she was doing in the ED.  CT head and MRI brain was without any acute abnormality. Other images which include chest x-ray, DG knee and hip was also without any acute abnormality or fractures. CT spine was negative for any acute abnormality. Per daughter patient has decreased p.o. intake for many months and gradually declining.  Per chart review patient is active with hospice services with [facility].   6/29: UA does not look infected.  Blood cultures pending.  No other obvious abnormality noted on labs except mildly elevated TSH at 4.7.  BNP at 212.  COVID PCR negative. echocardiogram with normal EF and no diastolic dysfunction.  Mildly dilated right and left atrium. Patient was placed on pressors overnight due to persistent hypotension.  We will try midodrine and try weaning her off from Levophed.  6/30: 1/4 blood cultures  blood cultures with Staphylococcus species, pending further identification and susceptibility.  Patient remained afebrile and no leukocytosis, based on her persistent hypotension requiring pressor we decided to start vancomycin.  Blood pressure improved overnight and she was able to weaned off from Levophed .Repeat blood cultures ordered. Patient was on multiple medications which can lower blood pressure.  No other sign of infection. Checking a.m. cortisol.  Procalcitonin is negative. If blood cultures remain negative by tomorrow we will discontinue vancomycin.  7/1: Repeat blood cultures remain negative.  Patient had low-grade fever overnight.  Otherwise at baseline.  Apparently cannot return to her facility until they will evaluate her for eligibility. Her facility cannot evaluate her until Wednesday.  7/2: Remained stable and afebrile.  First blood culture 1/4 bottles with staph Cohnii, not much significant and she received 2 days of vancomycin.  We will give her 3 more days of Keflex as she did had 1 episode of fever.  Waiting for facility evaluation so she can go back to her assisted living.  7/3: Patient remained stable.  [facility] will not be able to accept her today due to staffing issues.  Most likely can go tomorrow.   Assessment and Plan: * Altered mental status Resolved and patient is now at her baseline.  Her polypharmacy might be contributory to her significant hypotension requiring pressors.  Has been weaned off from pressors now.  Might be due to hypotension causing some brain anoxia and it improves with improvement in blood pressure with vasopressors. 1/4 blood cultures bottle with staph cohnii, she was started on vancomycin for concern of persistent hypotension.  Less likely an infection as she would not able to tell remained afebrile, no leukocytosis, all imaging remained inconclusive or without any acute abnormality.  Procalcitonin negative. -Repeat blood cultures-negative  in 24 hours but patient had 1 episode of low-grade fever overnight. -Check a.m. cortisol-within normal limit -Switch vancomycin with 3 more days of Keflex -She will need a good med rec as she was on multiple medications which can lower blood pressure and cause altered mental status in her age group.  Hypotension Patient was on carvedilol, losartan and furosemide at home.  She was also taking Norco and trazodone which can all lower the blood pressure and cause altered mental status Blood pressure now started trending up -Continue to monitor -Restart home carvedilol and losartan   Essential hypertension Blood patient started trending up.  Home carvedilol 6.25 mg p.o. twice daily, losartan 25 mg daily, furosemide 20 mg daily have been held at this time due to hypotension on presentation -Restart home carvedilol and losartan -Keep holding Lasix   Atrial fibrillation, chronic (HCC) - Resumed home Eliquis 2.5 mg p.o. twice daily - Restarting home carvedilol  Hypothyroidism TSH mildly elevated at 4.770 -Continue home Synthroid. -Will need a repeat testing in 4 weeks  Weakness Patient with declining health and advanced dementia.  History of poor p.o. intake. Might be a natural life trajectory. She is currently under hospice services. -PT evaluation-recommending returning to [facility] with home health PT.  Pupils of different size Baseline on left is 4-5 mm and do not dilate Right is regular shape and size and does dilate with light. CT head and MRI was without any acute abnormality  Protein-calorie malnutrition, moderate (HCC) Estimated body mass index is 23.15 kg/m as calculated from the following:   Height as of 04/17/21: 5\' 5"  (1.651 m).   Weight as of this encounter: 63.1 kg.   -Dietitian consult  Dementia without behavioral disturbance (HCC) - Continue home memantine  Bilateral lower extremity edema Patient was taking Lasix for lower extremity edema. Mostly stays in  wheelchair with hanging legs. Echocardiogram without any significant abnormality. Mildly elevated BNP at 212. Might be due to chronic venous stasis or dependent edema.  Mild hypoalbuminemia at 3.2 -Try raising legs -Compression stockings        Subjective: Patient was seen and examined today.  Sitting comfortably in chair and eating lunch.  No new complaints.  Physical Exam: Vitals:   09/19/21 2005 09/19/21 2033 09/20/21 0448 09/20/21 0759  BP: (!) 141/75 (!) 126/56 125/62 132/72  Pulse: (!) 106 (!) 102 98 92  Resp: 18 16 18 18   Temp: 99.5 F (37.5 C) 98.9 F (37.2 C) 98.5 F (36.9 C) 98.1 F (36.7 C)  TempSrc:      SpO2: 96% 94% 94% 94%  Weight:      Height:       General.  Pleasant elderly lady, in no acute distress. Pulmonary.  Lungs clear bilaterally, normal respiratory effort. CV.  Regular rate and rhythm, no JVD, rub or murmur. Abdomen.  Soft, nontender, nondistended, BS positive. CNS.  Alert and oriented to self.  No focal neurologic deficit. Extremities.  No edema, no cyanosis, pulses intact and symmetrical. Psychiatry.  Judgment and insight appears impaired.  Data Reviewed: Prior data reviewed  Family Communication:   Disposition: Status is: Inpatient Remains inpatient appropriate because: Severity of illness   Planned Discharge Destination: Skilled nursing facility  Time spent: 40 minutes  This record has been created using 11/21/21. Errors have been  sought and corrected,but may not always be located. Such creation errors do not reflect on the standard of care.  Author: Arnetha Courser, MD 09/20/2021 4:43 PM  For on call review [email or web address].

## 2021-09-21 DIAGNOSIS — R4182 Altered mental status, unspecified: Secondary | ICD-10-CM | POA: Diagnosis not present

## 2021-09-21 LAB — CBC
HCT: 38.1 % (ref 36.0–46.0)
Hemoglobin: 12.1 g/dL (ref 12.0–15.0)
MCH: 29.7 pg (ref 26.0–34.0)
MCHC: 31.8 g/dL (ref 30.0–36.0)
MCV: 93.6 fL (ref 80.0–100.0)
Platelets: 217 10*3/uL (ref 150–400)
RBC: 4.07 MIL/uL (ref 3.87–5.11)
RDW: 13.6 % (ref 11.5–15.5)
WBC: 5.4 10*3/uL (ref 4.0–10.5)
nRBC: 0 % (ref 0.0–0.2)

## 2021-09-21 NOTE — Assessment & Plan Note (Signed)
Patient was on carvedilol, losartan and furosemide at home.  She was also taking Norco and trazodone which can all lower the blood pressure and cause altered mental status Blood pressure now started trending up -Continue to monitor -Restarted home carvedilol and losartan

## 2021-09-21 NOTE — Progress Notes (Signed)
Progress Note   Patient: Gabrielle Navarro GEZ:[id number] DOB: [birth year] DOA: 09/15/2021     6 DOS: the patient was seen and examined on 09/21/2021   Brief hospital course: Mrs. Aluel Schwarz is a 88 year old female with history of hypothyroid, hypertension, insomnia, atrial fibrillation on Eliquis, chronic left PCA infarct, chronic right cerebellar infarct, cervical spondylosis, who presents emergency department from [facility] facility for chief concerns of altered mental status and falls.  Initial vitals in the emergency department showed temperature 97.3, respiration rate of 18, heart rate of 71, blood pressure 103/60, SPO2 99% on room air.  Serum sodium is 139, potassium 3.9, chloride 105, bicarb 25, BUN of 25, serum creatinine of 0.88, GFR greater than 60, nonfasting blood glucose 135, WBC 7.5, hemoglobin 13.2, platelets of 214.  WBC 7.5, hemoglobin 13.2, platelets of 214.  High sensitive troponin is 8. UA was negative for leukocytes and nitrates  ED treatment: Tylenol 1 g, LR 1 L bolus.  Patient with history of advanced dementia and at baseline recognizes daughter what she was doing in the ED.  CT head and MRI brain was without any acute abnormality. Other images which include chest x-ray, DG knee and hip was also without any acute abnormality or fractures. CT spine was negative for any acute abnormality. Per daughter patient has decreased p.o. intake for many months and gradually declining.  Per chart review patient is active with hospice services with [facility].   6/29: UA does not look infected.  Blood cultures pending.  No other obvious abnormality noted on labs except mildly elevated TSH at 4.7.  BNP at 212.  COVID PCR negative. echocardiogram with normal EF and no diastolic dysfunction.  Mildly dilated right and left atrium. Patient was placed on pressors overnight due to persistent hypotension.  We will try midodrine and try weaning her off from Levophed.  6/30: 1/4 blood cultures  blood cultures with Staphylococcus species, pending further identification and susceptibility.  Patient remained afebrile and no leukocytosis, based on her persistent hypotension requiring pressor we decided to start vancomycin.  Blood pressure improved overnight and she was able to weaned off from Levophed .Repeat blood cultures ordered. Patient was on multiple medications which can lower blood pressure.  No other sign of infection. Checking a.m. cortisol.  Procalcitonin is negative. If blood cultures remain negative by tomorrow we will discontinue vancomycin.  7/1: Repeat blood cultures remain negative.  Patient had low-grade fever overnight.  Otherwise at baseline.  Apparently cannot return to her facility until they will evaluate her for eligibility. Her facility cannot evaluate her until Wednesday.  7/2: Remained stable and afebrile.  First blood culture 1/4 bottles with staph Cohnii, not much significant and she received 2 days of vancomycin.  We will give her 3 more days of Keflex as she did had 1 episode of fever.  Waiting for facility evaluation so she can go back to her assisted living.  7/3: Patient remained stable.  [facility] will not be able to accept her today due to staffing issues.  Most likely can go tomorrow.  7/4: Patient remained stable.  [facility] will not take her until Thursday.   Assessment and Plan: * Altered mental status Resolved and patient is now at her baseline.  Her polypharmacy might be contributory to her significant hypotension requiring pressors.  Has been weaned off from pressors now.  Might be due to hypotension causing some brain anoxia and it improves with improvement in blood pressure with vasopressors. 1/4 blood cultures bottle  with staph cohnii, she was started on vancomycin for concern of persistent hypotension.  Less likely an infection as she would not able to tell remained afebrile, no leukocytosis, all imaging remained inconclusive or  without any acute abnormality.  Procalcitonin negative. -Repeat blood cultures-negative in 24 hours but patient had 1 episode of low-grade fever overnight. -Check a.m. cortisol-within normal limit -Switch vancomycin with 3 more days of Keflex-day 2 -She will need a good med rec as she was on multiple medications which can lower blood pressure and cause altered mental status in her age group.  Hypotension Patient was on carvedilol, losartan and furosemide at home.  She was also taking Norco and trazodone which can all lower the blood pressure and cause altered mental status Blood pressure now started trending up -Continue to monitor -Restarted home carvedilol and losartan   Essential hypertension Blood patient started trending up.  Home carvedilol 6.25 mg p.o. twice daily, losartan 25 mg daily, furosemide 20 mg daily have been held at this time due to hypotension on presentation -Restart home carvedilol and losartan -Keep holding Lasix   Atrial fibrillation, chronic (HCC) - Resumed home Eliquis 2.5 mg p.o. twice daily - Restarting home carvedilol  Hypothyroidism TSH mildly elevated at 4.770 -Continue home Synthroid. -Will need a repeat testing in 4 weeks  Weakness Patient with declining health and advanced dementia.  History of poor p.o. intake. Might be a natural life trajectory. She is currently under hospice services. -PT evaluation-recommending returning to [facility] with home health PT.  Pupils of different size Baseline on left is 4-5 mm and do not dilate Right is regular shape and size and does dilate with light. CT head and MRI was without any acute abnormality  Protein-calorie malnutrition, moderate (HCC) Estimated body mass index is 23.15 kg/m as calculated from the following:   Height as of 04/17/21: 5\' 5"  (1.651 m).   Weight as of this encounter: 63.1 kg.   -Dietitian consult  Dementia without behavioral disturbance (HCC) - Continue home  memantine  Bilateral lower extremity edema Patient was taking Lasix for lower extremity edema. Mostly stays in wheelchair with hanging legs. Echocardiogram without any significant abnormality. Mildly elevated BNP at 212. Might be due to chronic venous stasis or dependent edema.  Mild hypoalbuminemia at 3.2 -Try raising legs -Compression stockings        Subjective: Patient was lying down comfortably and reading a book when seen today.  No complaints.  Physical Exam: Vitals:   09/20/21 1704 09/20/21 2100 09/21/21 0459 09/21/21 0758  BP: (!) 152/89 (!) 110/55 (!) 112/59 131/63  Pulse: (!) 103 100 92 73  Resp: 18 18 16 16   Temp: 98.1 F (36.7 C) 99.2 F (37.3 C) 98 F (36.7 C) 97.9 F (36.6 C)  TempSrc: Oral     SpO2: 97% 95% 95% 98%  Weight:      Height:       General.  Hard of hearing elderly lady, in no acute distress. Pulmonary.  Lungs clear bilaterally, normal respiratory effort. CV.  Regular rate and rhythm, no JVD, rub or murmur. Abdomen.  Soft, nontender, nondistended, BS positive. CNS.  Alert and oriented .  No focal neurologic deficit. Extremities.  No edema, no cyanosis, pulses intact and symmetrical. Psychiatry.  Appears to have mild cognitive impairment  Data Reviewed: Prior data reviewed.  Family Communication: Unable to reach legal guardian on phone  Disposition: Status is: Inpatient Remains inpatient appropriate because: Medically stable, waiting for placement.   Planned Discharge Destination:  Skilled nursing facility  Time spent: 40 minutes  This record has been created using Conservation officer, historic buildings. Errors have been sought and corrected,but may not always be located. Such creation errors do not reflect on the standard of care.  Author: Arnetha Courser, MD 09/21/2021 3:07 PM  For on call review [email or web address].

## 2021-09-21 NOTE — Assessment & Plan Note (Signed)
Resolved and patient is now at her baseline.  Her polypharmacy might be contributory to her significant hypotension requiring pressors.  Has been weaned off from pressors now.  Might be due to hypotension causing some brain anoxia and it improves with improvement in blood pressure with vasopressors. 1/4 blood cultures bottle with staph cohnii, she was started on vancomycin for concern of persistent hypotension.  Less likely an infection as she would not able to tell remained afebrile, no leukocytosis, all imaging remained inconclusive or without any acute abnormality.  Procalcitonin negative. -Repeat blood cultures-negative in 24 hours but patient had 1 episode of low-grade fever overnight. -Check a.m. cortisol-within normal limit -Switch vancomycin with 3 more days of Keflex-day 2 -She will need a good med rec as she was on multiple medications which can lower blood pressure and cause altered mental status in her age group.

## 2021-09-21 NOTE — Plan of Care (Signed)

## 2021-09-21 NOTE — TOC Progression Note (Signed)
Transition of Care [facility]) - Progression Note    Patient Details  Name: Gabrielle Navarro MRN: [id number] Date of Birth: 1927/09/07  Transition of Care [facility]) CM/SW Contact  Gildardo Griffes, [place] Phone Number: 09/21/2021, 10:44 AM  Clinical Narrative:     [facility] reports receiving letter from hospice stating services were revoked, was reviewed by business office and they have approved patient however they report their pharmacy is closed today and they will not have a bed to admit until first thing Thursday morning. MD updated and patient's daughter aware.    Expected Discharge Plan: Assisted Living Barriers to Discharge: Continued Medical Work up  Expected Discharge Plan and Services Expected Discharge Plan: Assisted Living       Living arrangements for the past 2 months: Assisted Living Facility ([facility])                                       Social Determinants of Health (SDOH) Interventions    Readmission Risk Interventions     No data to display

## 2021-09-22 DIAGNOSIS — F0390 Unspecified dementia without behavioral disturbance: Secondary | ICD-10-CM

## 2021-09-22 DIAGNOSIS — R4182 Altered mental status, unspecified: Secondary | ICD-10-CM | POA: Diagnosis not present

## 2021-09-22 DIAGNOSIS — I482 Chronic atrial fibrillation, unspecified: Secondary | ICD-10-CM

## 2021-09-22 DIAGNOSIS — E039 Hypothyroidism, unspecified: Secondary | ICD-10-CM

## 2021-09-22 DIAGNOSIS — I10 Essential (primary) hypertension: Secondary | ICD-10-CM | POA: Diagnosis not present

## 2021-09-22 LAB — CULTURE, BLOOD (ROUTINE X 2): Culture: NO GROWTH

## 2021-09-22 MED ORDER — FUROSEMIDE 20 MG PO TABS: 10.0000 mg | ORAL_TABLET | Freq: Every day | ORAL | Status: DC | PRN

## 2021-09-22 NOTE — Progress Notes (Signed)
Occupational Therapy Treatment Patient Details Name: Gabrielle Navarro MRN: [id number] DOB: 28-Jun-1927 Today's Date: 09/22/2021   History of present illness presented to ER secondary to AMS, falls (slid from [facility]); admitted for management of AMS, generalized weakness, hypotension (requiring pressors at admission).  Imaging (head CT, c-spine, L hip, L knee, MRI) negative for acute injury/finding.  Of note, actively followed by [facility] at ALF; daughter does wish for therapy involvement with this hospitalization.   OT comments  Chart reviewed, pt greeted in chair agreeable to OT tx session. Tx session targeted improving functional mobility and ADL participation. STS completed 3x with MIN A. Grooming tasks completed with sitting. Per chart, pt daughter requests discharge to rehab to maximize functional performance. Discharge recommendation has been updated. Pt is left in bedside chair, NAD, all needs met. OT will follow acutely.    Recommendations for follow up therapy are one component of a multi-disciplinary discharge planning process, led by the attending physician.  Recommendations may be updated based on patient status, additional functional criteria and insurance authorization.    Follow Up Recommendations  Skilled nursing-short term rehab (<3 hours/day)    Assistance Recommended at Discharge Frequent or constant Supervision/Assistance  Patient can return home with the following  A lot of help with walking and/or transfers;A lot of help with bathing/dressing/bathroom   Equipment Recommendations  Other (comment) (per next venue of care)    Recommendations for Other Services      Precautions / Restrictions Precautions Precautions: Fall Restrictions Weight Bearing Restrictions: No       Mobility Bed Mobility               General bed mobility comments: NT pt in recliner pre/post session    Transfers Overall transfer level: Needs assistance Equipment used: Rolling walker  (2 wheels) Transfers: Sit to/from Stand Sit to Stand: Min assist (3 attempts, pt reports SOB, spo2 >90% on RA throughout)                 Balance Overall balance assessment: Needs assistance Sitting-balance support: No upper extremity supported, Feet supported Sitting balance-Leahy Scale: Good     Standing balance support: Bilateral upper extremity supported Standing balance-Leahy Scale: Poor                             ADL either performed or assessed with clinical judgement   ADL Overall ADL's : Needs assistance/impaired     Grooming: Brushing hair;Sitting;Set up                                      Extremity/Trunk Assessment              Vision       Perception     Praxis      Cognition Arousal/Alertness: Awake/alert Behavior During Therapy: WFL for tasks assessed/performed Overall Cognitive Status: History of cognitive impairments - at baseline                                 General Comments: oriented to self, follows simple one step commands        Exercises      Shoulder Instructions       General Comments      Pertinent Vitals/ Pain       Pain  Assessment Pain Assessment: No/denies pain  Home Living                                          Prior Functioning/Environment              Frequency  Min 2X/week        Progress Toward Goals  OT Goals(current goals can now be found in the care plan section)  Progress towards OT goals: Progressing toward goals     Plan Discharge plan needs to be updated    Co-evaluation                 AM-PAC OT "6 Clicks" Daily Activity     Outcome Measure   Help from another person eating meals?: A Little Help from another person taking care of personal grooming?: A Little Help from another person toileting, which includes using toliet, bedpan, or urinal?: A Lot Help from another person bathing (including washing,  rinsing, drying)?: A Lot Help from another person to put on and taking off regular upper body clothing?: A Little Help from another person to put on and taking off regular lower body clothing?: A Lot 6 Click Score: 15    End of Session Equipment Utilized During Treatment: Gait belt;Rolling walker (2 wheels)  OT Visit Diagnosis: Unsteadiness on feet (R26.81);Muscle weakness (generalized) (M62.81)   Activity Tolerance Patient tolerated treatment well   Patient Left in chair;with call bell/phone within reach;with chair alarm set   Nurse Communication Mobility status        Time: 1444-1500 OT Time Calculation (min): 16 min  Charges: OT General Charges $OT Visit: 1 Visit OT Treatments $Therapeutic Activity: 8-22 mins  Shanon Payor, OTD OTR/L  09/22/21, 3:36 PM

## 2021-09-22 NOTE — TOC Progression Note (Signed)
Transition of Care [facility]) - Progression Note    Patient Details  Name: Gabrielle Navarro MRN: [id number] Date of Birth: September 26, 1927  Transition [facility]) CM/SW Contact  Margarito Liner, LCSW Phone Number: 09/22/2021, 10:03 AM  Clinical Narrative:   Per [facility] worker, [facility] can accept patient today. Tried calling daughter x4 but call will not go through.  Expected Discharge Plan: Assisted Living Barriers to Discharge: Continued Medical Work up  Expected Discharge Plan and Services Expected Discharge Plan: Assisted Living       Living arrangements for the past 2 months: Assisted Living Facility ([facility])                                       Social Determinants of Health (SDOH) Interventions    Readmission Risk Interventions     No data to display

## 2021-09-22 NOTE — Discharge Summary (Signed)
Physician Discharge Summary   Patient: Gabrielle Navarro MRN: [id number] DOB: [birth year]  Admit date:     09/15/2021  Discharge date: 09/22/21  Discharge Physician: Arnetha Courser   PCP: Laverle Hobby, MD   Recommendations at discharge:  Follow-up with primary care provider within a week.  Discharge Diagnoses: Principal Problem:   Altered mental status Active Problems:   Hypotension   Essential hypertension   Atrial fibrillation, chronic (HCC)   Hypothyroidism   Weakness   Pupils of different size   Protein-calorie malnutrition, moderate (HCC)   Dementia without behavioral disturbance (HCC)   DNR (do not resuscitate)   Bilateral lower extremity edema   Hospital Course: Mrs. Gabrielle Navarro is a 88 year old female with history of hypothyroid, hypertension, insomnia, atrial fibrillation on Eliquis, chronic left PCA infarct, chronic right cerebellar infarct, cervical spondylosis, who presents emergency department from [facility] facility for chief concerns of altered mental status and falls.  Initial vitals in the emergency department showed temperature 97.3, respiration rate of 18, heart rate of 71, blood pressure 103/60, SPO2 99% on room air.  Serum sodium is 139, potassium 3.9, chloride 105, bicarb 25, BUN of 25, serum creatinine of 0.88, GFR greater than 60, nonfasting blood glucose 135, WBC 7.5, hemoglobin 13.2, platelets of 214.  WBC 7.5, hemoglobin 13.2, platelets of 214.  High sensitive troponin is 8. UA was negative for leukocytes and nitrates  ED treatment: Tylenol 1 g, LR 1 L bolus.  Patient with history of advanced dementia and at baseline recognizes daughter what she was doing in the ED.  CT head and MRI brain was without any acute abnormality. Other images which include chest x-ray, DG knee and hip was also without any acute abnormality or fractures. CT spine was negative for any acute abnormality. Per daughter patient has decreased p.o. intake for many months and gradually  declining.  Per chart review patient is active with hospice services with [facility].   6/29: UA does not look infected.  Blood cultures pending.  No other obvious abnormality noted on labs except mildly elevated TSH at 4.7.  BNP at 212.  COVID PCR negative. echocardiogram with normal EF and no diastolic dysfunction.  Mildly dilated right and left atrium. Patient was placed on pressors overnight due to persistent hypotension.  We will try midodrine and try weaning her off from Levophed.  6/30: 1/4 blood cultures blood cultures with Staphylococcus species, pending further identification and susceptibility.  Patient remained afebrile and no leukocytosis, based on her persistent hypotension requiring pressor we decided to start vancomycin.  Blood pressure improved overnight and she was able to weaned off from Levophed .Repeat blood cultures ordered. Patient was on multiple medications which can lower blood pressure.  No other sign of infection. Checking a.m. cortisol.  Procalcitonin is negative. If blood cultures remain negative by tomorrow we will discontinue vancomycin.  7/1: Repeat blood cultures remain negative.  Patient had low-grade fever overnight.  Otherwise at baseline.  Apparently cannot return to her facility until they will evaluate her for eligibility. Her facility cannot evaluate her until Wednesday.  7/2: Remained stable and afebrile.  First blood culture 1/4 bottles with staph Cohnii, not much significant and she received 2 days of vancomycin.  We will give her 3 more days of Keflex as she did had 1 episode of fever.  Waiting for facility evaluation so she can go back to her assisted living.  7/3: Patient remained stable.  [facility] will not be able to accept her today due  to staffing issues.  Most likely can go tomorrow.  7/4: Patient remained stable.  [facility] will not take her until Thursday.  7/5: Patient remained stable.  Completed the antibiotics.  [facility] offered bed for today where she is being discharged for further rehab.  Patient will continue with current medications and follow-up with her providers.  Assessment and Plan: * Altered mental status Resolved and patient is now at her baseline.  Her polypharmacy might be contributory to her significant hypotension requiring pressors.  Has been weaned off from pressors now.  Might be due to hypotension causing some brain anoxia and it improves with improvement in blood pressure with vasopressors. 1/4 blood cultures bottle with staph cohnii, she was started on vancomycin for concern of persistent hypotension.  Less likely an infection as she would not able to tell remained afebrile, no leukocytosis, all imaging remained inconclusive or without any acute abnormality.  Procalcitonin negative. -Repeat blood cultures-negative in 24 hours but patient had 1 episode of low-grade fever overnight. -Check a.m. cortisol-within normal limit -Switch vancomycin with 3 more days of Keflex-day 2 -She will need a good med rec as she was on multiple medications which can lower blood pressure and cause altered mental status in her age group.  Hypotension Patient was on carvedilol, losartan and furosemide at home.  She was also taking Norco and trazodone which can all lower the blood pressure and cause altered mental status Blood pressure now started trending up -Continue to monitor -Restarted home carvedilol and losartan   Essential hypertension Blood patient started trending up.  Home carvedilol 6.25 mg p.o. twice daily, losartan 25 mg daily, furosemide 20 mg daily have been held at this time due to hypotension on presentation -Restart home carvedilol and losartan -Keep holding Lasix   Atrial fibrillation, chronic (HCC) - Resumed home Eliquis 2.5 mg p.o. twice daily - Restarting home carvedilol  Hypothyroidism TSH mildly elevated at 4.770 -Continue home Synthroid. -Will need a repeat testing in  4 weeks  Weakness Patient with declining health and advanced dementia.  History of poor p.o. intake. Might be a natural life trajectory. She is currently under hospice services. -PT evaluation-recommending returning to [facility] with home health PT.  Pupils of different size Baseline on left is 4-5 mm and do not dilate Right is regular shape and size and does dilate with light. CT head and MRI was without any acute abnormality  Protein-calorie malnutrition, moderate (HCC) Estimated body mass index is 23.15 kg/m as calculated from the following:   Height as of 04/17/21: 5\' 5"  (1.651 m).   Weight as of this encounter: 63.1 kg.   -Dietitian consult  Dementia without behavioral disturbance (HCC) - Continue home memantine  Bilateral lower extremity edema Patient was taking Lasix for lower extremity edema. Mostly stays in wheelchair with hanging legs. Echocardiogram without any significant abnormality. Mildly elevated BNP at 212. Might be due to chronic venous stasis or dependent edema.  Mild hypoalbuminemia at 3.2 -Try raising legs -Compression stockings        Consultants: None Procedures performed: None Disposition: Skilled nursing facility Diet recommendation:  Discharge Diet Orders (From admission, onward)     Start     Ordered   09/22/21 0000  Diet - low sodium heart healthy        09/22/21 1211           Cardiac diet DISCHARGE MEDICATION: Allergies as of 09/22/2021       Reactions   Amoxicillin Anaphylaxis  Patient has tolerated cefdinir within the last year.  See full allergy assessment progress note from 10/9   Shellfish Allergy Anaphylaxis   Chocolate Flavor Hives   Sulfa Antibiotics Other (See Comments)   Latex Rash        Medication List     STOP taking these medications    HYDROcodone-acetaminophen 5-325 MG tablet Commonly known as: NORCO/VICODIN   Lagevrio 200 MG Caps capsule Generic drug: molnupiravir EUA   ondansetron 4 MG  disintegrating tablet Commonly known as: ZOFRAN-ODT   senna-docusate 8.6-50 MG tablet Commonly known as: Senokot-S       TAKE these medications    acetaminophen 325 MG tablet Commonly known as: TYLENOL Take 2 tablets (650 mg total) by mouth every 6 (six) hours as needed for mild pain or moderate pain (or temp > 37.5 C (99.5 F)).   apixaban 2.5 MG Tabs tablet Commonly known as: ELIQUIS Take 2.5 mg by mouth 2 (two) times daily.   carvedilol 6.25 MG tablet Commonly known as: COREG Take 6.25 mg by mouth 2 (two) times daily with a meal.   furosemide 20 MG tablet Commonly known as: LASIX Take 0.5 tablets (10 mg total) by mouth daily as needed for fluid or edema. What changed:  when to take this reasons to take this   lactose free nutrition Liqd Take 237 mLs by mouth 3 (three) times daily between meals.   levothyroxine 25 MCG tablet Commonly known as: SYNTHROID Take 25 mcg by mouth daily before breakfast.   losartan 25 MG tablet Commonly known as: COZAAR Take 25 mg by mouth 2 (two) times a day.   memantine 28 MG Cp24 24 hr capsule Commonly known as: NAMENDA XR Take 28 mg by mouth daily.   mirtazapine 7.5 MG tablet Commonly known as: REMERON Take 7.5 mg by mouth at bedtime. What changed: Another medication with the same name was removed. Continue taking this medication, and follow the directions you see here.   Mucus Relief 400 MG Tabs tablet Generic drug: guaifenesin Take 400 mg by mouth every 6 (six) hours as needed (cough).   omeprazole 10 MG capsule Commonly known as: PRILOSEC Take 10 mg by mouth daily.   ondansetron 4 MG tablet Commonly known as: ZOFRAN Take 4 mg by mouth every 6 (six) hours as needed for nausea or vomiting.   Stool Softener 100 MG capsule Generic drug: Docusate Sodium Take 100 mg by mouth 2 (two) times daily.   traZODone 100 MG tablet Commonly known as: DESYREL Take 100 mg by mouth at bedtime.        Contact information for  follow-up providers     Laverle Hobby, MD. Schedule an appointment as soon as possible for a visit in 1 week(s).   Specialty: Family Medicine Contact information: [address] [place] [address] [phone]              Contact information for after-discharge care     Destination     [facility] Preferred SNF .   Service: Skilled Nursing Contact information: [address] [place] [address] [phone]                    Discharge Exam: Ceasar Mons Weights   09/15/21 1043  Weight: 63.1 kg   General.  Hard of hearing elderly lady, in no acute distress. Pulmonary.  Lungs clear bilaterally, normal respiratory effort. CV.  Regular rate  and rhythm, no JVD, rub or murmur. Abdomen.  Soft, nontender, nondistended, BS positive. CNS.  Alert and oriented .  No focal neurologic deficit. Extremities.  No edema, no cyanosis, pulses intact and symmetrical. Psychiatry.  Appears to have mild cognitive impairment.  Condition at discharge: stable  The results of significant diagnostics from this hospitalization (including imaging, microbiology, ancillary and laboratory) are listed below for reference.   Imaging Studies: DG Foot Complete Right  Result Date: 09/18/2021 CLINICAL DATA:  Altered mental status, fall 1 night ago EXAM: RIGHT FOOT COMPLETE - 3+ VIEW COMPARISON:  None FINDINGS: Osseous demineralization. Joint spaces preserved. No acute fracture, dislocation, or bone destruction. IMPRESSION: No acute osseous abnormalities. Electronically Signed   By: Ulyses Southward M.D.   On: 09/18/2021 11:41   ECHOCARDIOGRAM COMPLETE  Result Date: 09/16/2021    ECHOCARDIOGRAM REPORT   Patient Name:   [facility] Date of Exam: 09/16/2021 Medical Rec #:  [id number]  Height:       65.0 in Accession #:    [id number] Weight:       139.1 lb Date of Birth:  Sep 25, 1927  BSA:          1.695 m Patient  Age:    88 years   BP:           101/85 mmHg Patient Gender: F          HR:           66 bpm. Exam Location:  [facility] Procedure: 2D Echo, Color Doppler and Cardiac Doppler Indications:     R06.00 Dyspnea  History:         Patient has prior history of Echocardiogram examinations, most                  recent 01/02/2021. Arrythmias:Atrial Fibrillation.  Sonographer:     Humphrey Rolls Referring Phys:  [id number] AMY N COX Diagnosing Phys: Marcina Millard MD  Sonographer Comments: Suboptimal apical window. IMPRESSIONS  1. Left ventricular ejection fraction, by estimation, is 60 to 65%. The left ventricle has normal function. The left ventricle has no regional wall motion abnormalities. Left ventricular diastolic parameters were normal.  2. Right ventricular systolic function is normal. The right ventricular size is normal.  3. Left atrial size was moderately dilated.  4. Right atrial size was moderately dilated.  5. The mitral valve is normal in structure. Moderate mitral valve regurgitation. No evidence of mitral stenosis.  6. Tricuspid valve regurgitation is moderate to severe.  7. The aortic valve is normal in structure. Aortic valve regurgitation is mild. No aortic stenosis is present.  8. The inferior vena cava is normal in size with greater than 50% respiratory variability, suggesting right atrial pressure of 3 mmHg. FINDINGS  Left Ventricle: Left ventricular ejection fraction, by estimation, is 60 to 65%. The left ventricle has normal function. The left ventricle has no regional wall motion abnormalities. The left ventricular internal cavity size was normal in size. There is  no left ventricular hypertrophy. Left ventricular diastolic parameters were normal. Right Ventricle: The right ventricular size is normal. No increase in right ventricular wall thickness. Right ventricular systolic function is normal. Left Atrium: Left atrial size was moderately dilated. Right Atrium: Right atrial size was moderately dilated.  Pericardium: There is no evidence of pericardial effusion. Mitral Valve: The mitral valve is normal in structure. Moderate mitral valve regurgitation. No evidence of mitral valve stenosis. Tricuspid Valve: The tricuspid valve is normal in structure. Tricuspid valve regurgitation is  moderate to severe. No evidence of tricuspid stenosis. Aortic Valve: The aortic valve is normal in structure. Aortic valve regurgitation is mild. Aortic regurgitation PHT measures 916 msec. No aortic stenosis is present. Aortic valve mean gradient measures 2.0 mmHg. Aortic valve peak gradient measures 2.9 mmHg. Aortic valve area, by VTI measures 1.65 cm. Pulmonic Valve: The pulmonic valve was normal in structure. Pulmonic valve regurgitation is not visualized. No evidence of pulmonic stenosis. Aorta: The aortic root is normal in size and structure. Venous: The inferior vena cava is normal in size with greater than 50% respiratory variability, suggesting right atrial pressure of 3 mmHg. IAS/Shunts: No atrial level shunt detected by color flow Doppler.  LEFT VENTRICLE PLAX 2D LVIDd:         3.77 cm   Diastology LVIDs:         2.50 cm   LV e' medial:    12.50 cm/s LV PW:         0.85 cm   LV E/e' medial:  6.3 LV IVS:        0.70 cm   LV e' lateral:   13.50 cm/s LVOT diam:     1.80 cm   LV E/e' lateral: 5.8 LV SV:         28 LV SV Index:   17 LVOT Area:     2.54 cm  RIGHT VENTRICLE RV Basal diam:  4.14 cm RV S prime:     11.20 cm/s LEFT ATRIUM           Index        RIGHT ATRIUM           Index LA diam:      4.50 cm 2.65 cm/m   RA Area:     28.40 cm LA Vol (A2C): 27.5 ml 16.22 ml/m  RA Volume:   81.70 ml  48.19 ml/m LA Vol (A4C): 77.8 ml 45.89 ml/m  AORTIC VALVE                    PULMONIC VALVE AV Area (Vmax):    1.55 cm     PV Vmax:       0.57 m/s AV Area (Vmean):   1.50 cm     PV Peak grad:  1.3 mmHg AV Area (VTI):     1.65 cm AV Vmax:           84.50 cm/s AV Vmean:          62.800 cm/s AV VTI:            0.171 m AV Peak Grad:       2.9 mmHg AV Mean Grad:      2.0 mmHg LVOT Vmax:         51.60 cm/s LVOT Vmean:        37.000 cm/s LVOT VTI:          0.111 m LVOT/AV VTI ratio: 0.65 AI PHT:            916 msec  AORTA Ao Root diam: 3.30 cm MITRAL VALVE               TRICUSPID VALVE MV Area (PHT): 6.40 cm    TR Peak grad:   28.7 mmHg MV Decel Time: 119 msec    TR Vmax:        268.00 cm/s MV E velocity: 78.65 cm/s  SHUNTS                            Systemic VTI:  0.11 m                            Systemic Diam: 1.80 cm Marcina Millard MD Electronically signed by Marcina Millard MD Signature Date/Time: 09/16/2021/1:54:12 PM    Final    MR BRAIN WO CONTRAST  Result Date: 09/15/2021 CLINICAL DATA:  Acute neuro deficit.  Fall last night. EXAM: MRI HEAD WITHOUT CONTRAST TECHNIQUE: Multiplanar, multiecho pulse sequences of the brain and surrounding structures were obtained without intravenous contrast. COMPARISON:  CT head 09/15/2021 FINDINGS: Brain: Negative for acute infarct. Mild atrophy, generalized atrophy with more advanced atrophy in the hippocampus bilaterally. This may be associated with Alzheimer's type dementia. Chronic hemorrhagic infarct left occipital lobe. Mild chronic microvascular ischemic change in the white matter. Chronic microhemorrhage left parietal lobe. Small chronic infarct right cerebellum Vascular: Normal arterial flow voids at the skull base Skull and upper cervical spine: No focal lesion. Sinuses/Orbits: Negative Other: None IMPRESSION: Negative for acute infarct. Prominent hippocampal atrophy bilaterally Chronic ischemic changes above. Electronically Signed   By: Marlan Palau M.D.   On: 09/15/2021 14:15   CT HEAD WO CONTRAST ( )  Result Date: 09/15/2021 CLINICAL DATA:  Fall last night. EXAM: CT HEAD WITHOUT CONTRAST CT CERVICAL SPINE WITHOUT CONTRAST TECHNIQUE: Multidetector CT imaging of the head and cervical spine was performed following the standard protocol without intravenous  contrast. Multiplanar CT image reconstructions of the cervical spine were also generated. RADIATION DOSE REDUCTION: This exam was performed according to the departmental dose-optimization program which includes automated exposure control, adjustment of the mA and/or kV according to patient size and/or use of iterative reconstruction technique. COMPARISON:  CT head 01/01/2021 FINDINGS: CT HEAD FINDINGS Brain: Negative for acute infarct, hemorrhage, mass Mild atrophy. Chronic left occipital infarct. Small chronic infarct right cerebellum. Chronic microvascular ischemic change in the white matter. Vascular: Negative for hyperdense vessel Skull: Negative Sinuses/Orbits: Mild mucosal edema right maxillary sinus. Remaining sinuses clear. Bilateral cataract extraction Other: None CT CERVICAL SPINE FINDINGS Alignment: Mild retrolisthesis C4-5 and C5-6 Skull base and vertebrae: Negative for fracture Soft tissues and spinal canal: No soft tissue mass or edema. Surgical clips in the left neck. Disc levels: Disc and facet degeneration in the cervical spine. Disc degeneration and spurring most prominent C3 through C6. No significant spinal stenosis. Upper chest: Lung apices clear bilaterally Other: None IMPRESSION: 1. No acute intracranial injury. Chronic ischemic changes most notably chronic left PCA infarct 2. Negative for cervical spine fracture. Mild to moderate cervical spondylosis. Electronically Signed   By: Marlan Palau M.D.   On: 09/15/2021 11:32   CT Cervical Spine Wo Contrast  Result Date: 09/15/2021 CLINICAL DATA:  Fall last night. EXAM: CT HEAD WITHOUT CONTRAST CT CERVICAL SPINE WITHOUT CONTRAST TECHNIQUE: Multidetector CT imaging of the head and cervical spine was performed following the standard protocol without intravenous contrast. Multiplanar CT image reconstructions of the cervical spine were also generated. RADIATION DOSE REDUCTION: This exam was performed according to the departmental dose-optimization  program which includes automated exposure control, adjustment of the mA and/or kV according to patient size and/or use of iterative reconstruction technique. COMPARISON:  CT head 01/01/2021 FINDINGS: CT HEAD FINDINGS Brain: Negative for acute infarct, hemorrhage, mass Mild atrophy. Chronic left occipital infarct. Small chronic infarct right cerebellum.  Chronic microvascular ischemic change in the white matter. Vascular: Negative for hyperdense vessel Skull: Negative Sinuses/Orbits: Mild mucosal edema right maxillary sinus. Remaining sinuses clear. Bilateral cataract extraction Other: None CT CERVICAL SPINE FINDINGS Alignment: Mild retrolisthesis C4-5 and C5-6 Skull base and vertebrae: Negative for fracture Soft tissues and spinal canal: No soft tissue mass or edema. Surgical clips in the left neck. Disc levels: Disc and facet degeneration in the cervical spine. Disc degeneration and spurring most prominent C3 through C6. No significant spinal stenosis. Upper chest: Lung apices clear bilaterally Other: None IMPRESSION: 1. No acute intracranial injury. Chronic ischemic changes most notably chronic left PCA infarct 2. Negative for cervical spine fracture. Mild to moderate cervical spondylosis. Electronically Signed   By: Marlan Palauharles  Clark M.D.   On: 09/15/2021 11:32   DG Knee Complete 4 Views Left  Result Date: 09/15/2021 CLINICAL DATA:  88 year old female with history of fall, altered mental status. EXAM: LEFT KNEE - COMPLETE 4+ VIEW COMPARISON:  04/18/2021 FINDINGS: No evidence of fracture, dislocation, or joint effusion. Postsurgical changes after left total knee arthroplasty without evidence of hardware loosening, fracture, or malalignment. Atherosclerotic calcifications are noted. IMPRESSION: 1. No acute fracture or malalignment. 2. Status post left total knee arthroplasty without complicating features. Electronically Signed   By: Marliss Cootsylan  Suttle M.D.   On: 09/15/2021 11:21   DG HIP UNILAT WITH PELVIS 2-3 VIEWS  LEFT  Result Date: 09/15/2021 CLINICAL DATA:  Fall. EXAM: DG HIP (WITH OR WITHOUT PELVIS) 2-3V LEFT COMPARISON:  None Available. FINDINGS: No acute fracture or hip dislocation is identified. There is mild-to-moderate left hip joint space narrowing with mild marginal spurring. Mild hip joint space narrowing is also noted on the right. Atherosclerotic vascular calcifications are present. IMPRESSION: No acute osseous abnormality identified. Electronically Signed   By: Sebastian AcheAllen  Grady M.D.   On: 09/15/2021 11:20   DG Chest Portable 1 View  Result Date: 09/15/2021 CLINICAL DATA:  88 year old female with altered mental status status post fall. EXAM: PORTABLE CHEST - 1 VIEW COMPARISON:  None Available. FINDINGS: The mediastinal contours are within normal limits. Unchanged cardiomegaly. Atherosclerotic calcification of the aortic arch. The lungs are clear bilaterally without evidence of focal consolidation, pleural effusion, or pneumothorax. Cholecystectomy clips in the right upper quadrant. No acute osseous abnormality. IMPRESSION: 1. No acute cardiopulmonary process. 2. Unchanged cardiomegaly. 3.  Aortic Atherosclerosis (ICD10-I70.0). Electronically Signed   By: Marliss Cootsylan  Suttle M.D.   On: 09/15/2021 11:17    Microbiology: Results for orders placed or performed during the hospital encounter of 09/15/21  SARS Coronavirus 2 by RT PCR (hospital order, performed in [facility] lab) *cepheid single result test*     Status: None   Collection Time: 09/15/21  1:03 PM   Specimen: Nasal Swab  Result Value Ref Range Status   SARS Coronavirus 2 by RT PCR NEGATIVE NEGATIVE Final    Comment: (NOTE) SARS-CoV-2 target nucleic acids are NOT DETECTED.  The SARS-CoV-2 RNA is generally detectable in upper and lower respiratory specimens during the acute phase of infection. The lowest concentration of SARS-CoV-2 viral copies this assay can detect is 250 copies / mL. A negative result does not preclude SARS-CoV-2  infection and should not be used as the sole basis for treatment or other patient management decisions.  A negative result may occur with improper specimen collection / handling, submission of specimen other than nasopharyngeal swab, presence of viral mutation(s) within the areas targeted by this assay, and inadequate number of viral copies (<250 copies /  mL). A negative result must be combined with clinical observations, patient history, and epidemiological information.  Fact Sheet for Patients:   RoadLapTop.co.za  Fact Sheet for Healthcare Providers: http://kim-miller.com/  This test is not yet approved or  cleared by the [place] FDA and has been authorized for detection and/or diagnosis of SARS-CoV-2 by FDA under an Emergency Use Authorization (EUA).  This EUA will remain in effect (meaning this test can be used) for the duration of the COVID-19 declaration under Section 564(b)(1) of the Act, 21 U.S.C. section 360bbb-3(b)(1), unless the authorization is terminated or revoked sooner.  Performed at [facility], [address] [place], [place] [address]   Culture, blood (Routine X 2) w Reflex to ID Panel     Status: None   Collection Time: 09/15/21  1:09 PM   Specimen: BLOOD  Result Value Ref Range Status   Specimen Description BLOOD LEFT [facility]  Final   Special Requests   Final    BOTTLES DRAWN AEROBIC AND ANAEROBIC Blood Culture adequate volume   Culture   Final    NO GROWTH 5 DAYS Performed at [facility], [address] [place], [place] [address]    Report Status 09/20/2021 FINAL  Final  Culture, blood (Routine X 2) w Reflex to ID Panel     Status: Abnormal   Collection Time: 09/15/21  1:09 PM   Specimen: BLOOD  Result Value Ref Range Status   Specimen Description   Final    BLOOD RIGHT [facility] Performed at [facility], [address] [place], [place] [address]    Special Requests   Final     BOTTLES DRAWN AEROBIC AND ANAEROBIC Blood Culture adequate volume Performed at [facility], [address] [place], [place] [address]    Culture  Setup Time   Final    Organism ID to follow GRAM POSITIVE COCCI ANAEROBIC BOTTLE ONLY CRITICAL RESULT CALLED TO, READ BACK BY AND VERIFIED WITH: RAQUEL GUZMAN 09/16/21 1547 SLM Performed at [facility], [address] [place], [place] [address]    Culture (A)  Final    STAPHYLOCOCCUS COHNII THE SIGNIFICANCE OF ISOLATING THIS ORGANISM FROM A SINGLE SET OF BLOOD CULTURES WHEN MULTIPLE SETS ARE DRAWN IS UNCERTAIN. PLEASE NOTIFY THE MICROBIOLOGY DEPARTMENT WITHIN ONE WEEK IF SPECIATION AND SENSITIVITIES ARE REQUIRED. Performed at [facility], [address] [place], [place] [address]    Report Status 09/18/2021 FINAL  Final  Blood Culture ID Panel (Reflexed)     Status: Abnormal   Collection Time: 09/15/21  1:09 PM  Result Value Ref Range Status   Enterococcus faecalis NOT DETECTED NOT DETECTED Final   Enterococcus Faecium NOT DETECTED NOT DETECTED Final   Listeria monocytogenes NOT DETECTED NOT DETECTED Final   Staphylococcus species DETECTED (A) NOT DETECTED Final    Comment: CRITICAL RESULT CALLED TO, READ BACK BY AND VERIFIED WITH: RAQUEL GUZMAN 09/16/21 1547 SLM    Staphylococcus aureus (BCID) NOT DETECTED NOT DETECTED Final   Staphylococcus epidermidis NOT DETECTED NOT DETECTED Final   Staphylococcus lugdunensis NOT DETECTED NOT DETECTED Final   Streptococcus species NOT DETECTED NOT DETECTED Final   Streptococcus agalactiae NOT DETECTED NOT DETECTED Final   Streptococcus pneumoniae NOT DETECTED NOT DETECTED Final   Streptococcus pyogenes NOT DETECTED NOT DETECTED Final   A.calcoaceticus-baumannii NOT DETECTED NOT DETECTED Final   Bacteroides fragilis NOT DETECTED NOT DETECTED Final   Enterobacterales NOT DETECTED NOT DETECTED Final   Enterobacter cloacae complex NOT  DETECTED NOT DETECTED Final    Escherichia coli NOT DETECTED NOT DETECTED Final   Klebsiella aerogenes NOT DETECTED NOT DETECTED Final   Klebsiella oxytoca NOT DETECTED NOT DETECTED Final   Klebsiella pneumoniae NOT DETECTED NOT DETECTED Final   Proteus species NOT DETECTED NOT DETECTED Final   Salmonella species NOT DETECTED NOT DETECTED Final   Serratia marcescens NOT DETECTED NOT DETECTED Final   Haemophilus influenzae NOT DETECTED NOT DETECTED Final   Neisseria meningitidis NOT DETECTED NOT DETECTED Final   Pseudomonas aeruginosa NOT DETECTED NOT DETECTED Final   Stenotrophomonas maltophilia NOT DETECTED NOT DETECTED Final   Candida albicans NOT DETECTED NOT DETECTED Final   Candida auris NOT DETECTED NOT DETECTED Final   Candida glabrata NOT DETECTED NOT DETECTED Final   Candida krusei NOT DETECTED NOT DETECTED Final   Candida parapsilosis NOT DETECTED NOT DETECTED Final   Candida tropicalis NOT DETECTED NOT DETECTED Final   Cryptococcus neoformans/gattii NOT DETECTED NOT DETECTED Final    Comment: Performed at [facility], [address] [place], [place] [address]  Culture, blood (Routine X 2) w Reflex to ID Panel     Status: None   Collection Time: 09/17/21 12:37 PM   Specimen: BLOOD  Result Value Ref Range Status   Specimen Description BLOOD RIGHT [facility]  Final   Special Requests   Final    BOTTLES DRAWN AEROBIC AND ANAEROBIC Blood Culture results may not be optimal due to an inadequate volume of blood received in culture bottles   Culture   Final    NO GROWTH 5 DAYS Performed at [facility], [address] [place], [place] [address]    Report Status 09/22/2021 FINAL  Final  Culture, blood (Routine X 2) w Reflex to ID Panel     Status: None (Preliminary result)   Collection Time: 09/19/21  6:19 PM   Specimen: BLOOD  Result Value Ref Range Status   Specimen Description BLOOD [facility]  Final   Special Requests BOTTLES DRAWN AEROBIC AND ANAEROBIC [facility]  Final   Culture   Final    NO GROWTH  3 DAYS Performed at [facility], [address] [place], [place] [address]    Report Status PENDING  Incomplete    Labs: CBC: Recent Labs  Lab 09/16/21 0614 09/18/21 0640 09/21/21 0450  WBC 6.8 5.9 5.4  HGB 12.5 12.0 12.1  HCT 39.4 37.2 38.1  MCV 93.6 94.7 93.6  PLT 235 168 217   Basic Metabolic Panel: Recent Labs  Lab 09/16/21 0614 09/19/21 0637  NA 137  --   K 4.0  --   CL 106  --   CO2 26  --   GLUCOSE 178*  --   BUN 24*  --   CREATININE 0.87 0.70  CALCIUM 8.9  --    Liver Function Tests: No results for input(s): "AST", "ALT", "ALKPHOS", "BILITOT", "PROT", "ALBUMIN" in the last 168 hours. CBG: No results for input(s): "GLUCAP" in the last 168 hours.  Discharge time spent: greater than 30 minutes.  This record has been created using Conservation officer, historic buildings. Errors have been sought and corrected,but may not always be located. Such creation errors do not reflect on the standard of care.   Signed: Arnetha Courser, MD [facility] 09/22/2021

## 2021-09-22 NOTE — Care Management Important Message (Signed)
Important Message  Patient Details  Name: Gabrielle Navarro MRN: [id number] Date of Birth: March 04, 1928   Medicare Important Message Given:  Yes  I reviewed the Important Message from Medicare with the patients daughter, [name] ([phone]) and she is aware of her rights and signed when she was admitted to the hospital. A copy of the form emailed to her at [email or web address] as requested. I thanked her for her time.    Olegario Messier A [name] 09/22/2021, 11:29 AM

## 2021-09-22 NOTE — TOC Transition Note (Signed)
Transition of Care [facility]) - CM/SW Discharge Note   Patient Details  Name: Gabrielle Navarro MRN: [id number] Date of Birth: 1927/05/19  Transition of Care [facility]) CM/SW Contact:  Margarito Liner, LCSW Phone Number: 09/22/2021, 1:03 PM   Clinical Narrative:  Patient has orders to discharge to [facility] today. RN will call report to [phone] (Room 511). EMS transport has been arranged and she is 2nd on the list. No further concerns. CSW signing off.   Final next level of care: Skilled Nursing Facility Barriers to Discharge: Barriers Resolved   Patient Goals and CMS Choice Patient states their goals for this hospitalization and ongoing recovery are:: to go home CMS Medicare.gov Compare Post Acute Care list provided to:: Patient Represenative (must comment) Choice offered to / list presented to : Adult Children  Discharge Placement   Existing PASRR number confirmed : 09/17/21          Patient chooses bed at: [facility] Patient to be transferred to facility by: EMS Name of family member notified: [name] Patient and family notified of of transfer: 09/22/21  Discharge Plan and Services                                     Social Determinants of Health (SDOH) Interventions     Readmission Risk Interventions     No data to display

## 2021-09-24 LAB — CULTURE, BLOOD (ROUTINE X 2): Culture: NO GROWTH

## 2021-10-22 ENCOUNTER — Non-Acute Institutional Stay: Payer: Medicare Other | Admitting: Student

## 2021-10-22 DIAGNOSIS — F0150 Vascular dementia without behavioral disturbance: Secondary | ICD-10-CM

## 2021-10-22 DIAGNOSIS — R531 Weakness: Secondary | ICD-10-CM

## 2021-10-22 DIAGNOSIS — R634 Abnormal weight loss: Secondary | ICD-10-CM

## 2021-10-22 DIAGNOSIS — E43 Unspecified severe protein-calorie malnutrition: Secondary | ICD-10-CM

## 2021-10-22 DIAGNOSIS — R600 Localized edema: Secondary | ICD-10-CM

## 2021-10-22 DIAGNOSIS — Z515 Encounter for palliative care: Secondary | ICD-10-CM

## 2021-10-22 NOTE — Progress Notes (Signed)
[facility] Consult Note Telephone: [phone]  Fax: [phone]   Date of encounter: 10/22/21 3:00pm PATIENT NAME: Gabrielle Navarro [address] [place] [address]   [phone] (home)  DOB: Feb 06, 1928 MRN: [id number] PRIMARY CARE PROVIDER:    Merrily Brittle, NP  REFERRING PROVIDER:   Merrily Brittle, NP  RESPONSIBLE PARTY:    Contact Information     Name Relation Home Work Mobile   [place]   [phone]        I met face to face with patient in the facility. Palliative Care was asked to follow this patient by consultation request of  Merrily Brittle, NP to address advance care planning and complex medical decision making. This is the initial visit.                                     ASSESSMENT AND PLAN / RECOMMENDATIONS:   Advance Care Planning/Goals of Care: Goals include to maximize quality of life and symptom management. Patient/health care surrogate gave his/her permission to discuss.Our advance care planning conversation included a discussion about:    The value and importance of advance care planning  Experiences with loved ones who have been seriously ill or have died  Exploration of personal, cultural or spiritual beliefs that might influence medical decisions  Exploration of goals of care in the event of a sudden injury or illness  CODE STATUS: DNR  Education provided on Palliative Medicine. Patient previously on hospice. She is to start receiving therapy. Will monitor for changes, declines and will refer back to hospice when she meets criteria.   Symptom Management/Plan:  Mixed Alzheimer's and Vascular Dementia- patient resides at [facility] on memory unit. Requires assistance with adl's. Staff to assist with adl's, reorient and redirect as needed. Continue memantine as directed.   BLE edema-continue furosemide QD PRN, elevate legs when in bed. Recommend  compression stockings on each am/off each night.   Generalized weakness- patient to start therapy. Staff to assist with adl's. Wheelchair for locomotion.   Weight loss, protein calorie malnutrition-current weight 123 pounds; patient with dietary restrictions. Encourage foods she enjoys, continue Mighty shakes TID. Routine weights per facility. Albumin 3.4 09/15/21. Will monitor intake and for weight loss.  Follow up Palliative Care Visit: Palliative care will continue to follow for complex medical decision making, advance care planning, and clarification of goals. Return in 4-6 weeks or prn.  This visit was coded based on medical decision making (MDM).  PPS: 30%  HOSPICE ELIGIBILITY/DIAGNOSIS: TBD  Chief Complaint: Palliative Medicine initial consult.   HISTORY OF PRESENT ILLNESS:  Gabrielle Navarro is a 88 y.o. year old female  with Alzheimer's and vascular dementia, atrial fibrillation, hypertension, T2DM, depression, hypothyroidism, pseudogout, weight loss. Patient recently hospitalized 6/28-09/22/21 due to AMS. Patient previously on hospice; daughter requested to start with palliative and then transition to hospice.  Patient currently resides at [facility] on locked memory unit. She requires assistance with all adl's. She has been evaluated by therapy and will receiving therapy services. Patient denies pain, shortness of breath. Her appetite has been fair per staff; she does have multiple dietary restrictions. Patient received sitting up to w/c in her room. She has pleasant affect; hard of hearing. Patient repeats herself throughout visit. She talks about [place] up [place] a farm; speaks of her children.  History obtained from review of EMR, discussion with primary team, and interview with family, facility staff/caregiver and/or Ms. Penny.  I reviewed available labs, medications, imaging, studies and related documents from the EMR.  Records reviewed and summarized above.   ROS  Patient  unable to contribute d/t her dementia.  Physical Exam: Weight: 123 pounds Pulse 76, resp 16, b/p 130/70, sats 96% on room air Constitutional: NAD General: frail appearing, thin EYES: anicteric sclera, lids intact, no discharge  ENMT: hard of hearing, oral mucous membranes moist CV: S1S2, RRR, 2+ BLE pitting edema Pulmonary: LCTA, no increased work of breathing, no cough, room air Abdomen:, normo-active BS + 4 quadrants, soft and non tender, no ascites GU: deferred MSK: + sarcopenia, moves all extremities, w/c bound Skin: warm and dry, no rashes or wounds on visible skin Neuro: + generalized weakness, A & O to person Psych: non-anxious affect, pleasant Hem/lymph/immuno: no widespread bruising CURRENT PROBLEM LIST:  Patient Active Problem List   Diagnosis Date Noted   Bilateral lower extremity edema 09/16/2021   Altered mental status 09/15/2021   Hypotension 09/15/2021   Pupils of different size 09/15/2021   Protein-calorie malnutrition, moderate ([place]) 09/15/2021   DNR (do not resuscitate) 09/15/2021   Pseudogout    Anxiety and depression    Weakness 01/01/2021   Atrial fibrillation, chronic ([place]) 09/06/2019   Dementia without behavioral disturbance ([place]) 09/06/2019   HOH (hard of hearing) 09/06/2019   GERD (gastroesophageal reflux disease) 09/06/2019   Essential hypertension 09/06/2019   Hypothyroidism 09/06/2019   PAST MEDICAL HISTORY:  Active Ambulatory Problems    Diagnosis Date Noted   Atrial fibrillation, chronic ([place]) 09/06/2019   Dementia without behavioral disturbance ([place]) 09/06/2019   HOH (hard of hearing) 09/06/2019   GERD (gastroesophageal reflux disease) 09/06/2019   Essential hypertension 09/06/2019   Hypothyroidism 09/06/2019   Weakness 01/01/2021   Anxiety and depression    Pseudogout    Altered mental status 09/15/2021   Hypotension 09/15/2021   Pupils of different size 09/15/2021   Protein-calorie malnutrition, moderate ([place]) 09/15/2021   DNR (do  not resuscitate) 09/15/2021   Bilateral lower extremity edema 09/16/2021   Resolved Ambulatory Problems    Diagnosis Date Noted   Syncope and collapse 09/06/2019   Cellulitis 10/03/2020   Facial cellulitis    Dental abscess    TIA (transient ischemic attack) 01/01/2021   Acute pain of right knee    Pain of toe of right foot    Dehydration    Impaired fasting glucose    Knee pain 01/02/2021   AKI (acute kidney injury) ([place])    Past Medical History:  Diagnosis Date   Atrial fibrillation (HCC)    Dementia ([place])    SOCIAL HX:  Social History   Tobacco Use   Smoking status: Never   Smokeless tobacco: Never  Substance Use Topics   Alcohol use: Never   FAMILY HX: No family history on file.    ALLERGIES:  Allergies  Allergen Reactions   Amoxicillin Anaphylaxis    Patient has tolerated cefdinir within the last year.  See full allergy assessment progress note from 10/9   Shellfish Allergy Anaphylaxis   Chocolate Flavor Hives   Sulfa Antibiotics Other (See Comments)   Latex Rash     PERTINENT MEDICATIONS:  Outpatient Encounter Medications as of 10/22/2021  Medication Sig   acetaminophen (TYLENOL) 325 MG tablet Take 2 tablets (650 mg total) by mouth every 6 (six) hours as needed for mild pain or moderate pain (  or temp > 37.5 C (99.5 F)).   apixaban (ELIQUIS) 2.5 MG TABS tablet Take 2.5 mg by mouth 2 (two) times daily.    carvedilol (COREG) 6.25 MG tablet Take 6.25 mg by mouth 2 (two) times daily with a meal.    furosemide (LASIX) 20 MG tablet Take 0.5 tablets (10 mg total) by mouth daily as needed for fluid or edema.   lactose free nutrition (BOOST) LIQD Take 237 mLs by mouth 3 (three) times daily between meals.   levothyroxine (SYNTHROID, LEVOTHROID) 25 MCG tablet Take 25 mcg by mouth daily before breakfast.    losartan (COZAAR) 25 MG tablet Take 25 mg by mouth 2 (two) times a day.   memantine (NAMENDA XR) 28 MG CP24 24 hr capsule Take 28 mg by mouth daily.   mirtazapine  (REMERON) 7.5 MG tablet Take 7.5 mg by mouth at bedtime. (Patient not taking: Reported on 09/15/2021)   MUCUS RELIEF 400 MG TABS tablet Take 400 mg by mouth every 6 (six) hours as needed (cough).   omeprazole (PRILOSEC) 10 MG capsule Take 10 mg by mouth daily.    ondansetron (ZOFRAN) 4 MG tablet Take 4 mg by mouth every 6 (six) hours as needed for nausea or vomiting.   STOOL SOFTENER 100 MG capsule Take 100 mg by mouth 2 (two) times daily.   traZODone (DESYREL) 100 MG tablet Take 100 mg by mouth at bedtime.    [DISCONTINUED] QUEtiapine (SEROQUEL) 25 MG tablet Take 25 mg by mouth every evening.   No facility-administered encounter medications on file as of 10/22/2021.   Thank you for the opportunity to participate in the care of Ms. Gent.  The palliative care team will continue to follow. Please call our office at [phone] if we can be of additional assistance.   Ezekiel Slocumb, NP   COVID-19 PATIENT SCREENING TOOL Asked and negative response unless otherwise noted:  Have you had symptoms of covid, tested positive or been in contact with someone with symptoms/positive test in the past 5-10 days? No

## 2021-10-25 ENCOUNTER — Telehealth: Payer: Self-pay | Admitting: Student

## 2021-10-25 NOTE — Telephone Encounter (Signed)
Palliative NP spoke with patient's daughter Elease Hashimoto. Reviewed goals of care; patient will be residing at [facility] long term. She currently  receiving PT/OT. Discussed monitoring for changes/declines, weight loss. Will refer back to hospice when she meets requirements.

## 2021-12-10 ENCOUNTER — Non-Acute Institutional Stay: Payer: Medicare Other | Admitting: Student

## 2021-12-10 DIAGNOSIS — E43 Unspecified severe protein-calorie malnutrition: Secondary | ICD-10-CM

## 2021-12-10 DIAGNOSIS — F0150 Vascular dementia without behavioral disturbance: Secondary | ICD-10-CM

## 2021-12-10 DIAGNOSIS — R531 Weakness: Secondary | ICD-10-CM

## 2021-12-10 DIAGNOSIS — Z515 Encounter for palliative care: Secondary | ICD-10-CM

## 2021-12-10 NOTE — Progress Notes (Signed)
[facility] Consult Note Telephone: [phone]  Fax: [phone]    Date of encounter: 12/10/21 4:50 PM PATIENT NAME: Gabrielle Navarro [address] [place] [address]   [phone] (home)  DOB: Aug 11, 1927 MRN: [id number] PRIMARY CARE PROVIDER:    Merrily Brittle, NP  REFERRING PROVIDER:   Merrily Brittle, NP  RESPONSIBLE PARTY:    Contact Information     Name Relation Home Work Mobile   [place]   [phone]        I met face to face with patient in the facility. Palliative Care was asked to follow this patient by consultation request of Merrily Brittle, NP to address advance care planning and complex medical decision making. This is a follow up visit.                                   ASSESSMENT AND PLAN / RECOMMENDATIONS:   Advance Care Planning/Goals of Care: Goals include to maximize quality of life and symptom management. Patient/health care surrogate gave his/her permission to discuss. CODE STATUS: DNR  Education provided on Palliative Medicine. Patient previously on hospice. She is to start receiving therapy. Will monitor for changes, declines and will refer back to hospice when she meets criteria.   Symptom Management/Plan:  Mixed Alzheimer's and Vascular Dementia- patient resides at [facility] on memory unit. Requires assistance with adl's. Staff to assist with adl's, reorient and redirect as needed. Continue memantine as directed.    Generalized weakness- Staff to assist with adl's. Wheelchair for locomotion. Therapy as directed.    Weight loss, protein calorie malnutrition- most recent weight 115 pounds; patient with dietary restrictions. Encourage foods she enjoys, continue Mighty shakes TID. Routine weights per facility. Albumin 3.4 09/15/21. Will monitor intake and for weight loss.  Follow up Palliative Care Visit: Palliative care will continue to follow for  complex medical decision making, advance care planning, and clarification of goals. Return IN 6-8 weeks or prn.   This visit was coded based on medical decision making (MDM).  PPS: 30%  HOSPICE ELIGIBILITY/DIAGNOSIS: TBD  Chief Complaint: Palliative Medicine follow up visit.   HISTORY OF PRESENT ILLNESS:  Gabrielle Navarro is a 88 y.o. year old female  with Alzheimer's and vascular dementia, atrial fibrillation, hypertension, T2DM, depression, hypothyroidism, pseudogout, weight loss. Patient previously on hospice; daughter requested to start with palliative and then transition to hospice.  Patient currently resides at [facility] on locked memory unit. She requires assistance with all adl's. She is out of bed daily to w/c. Patient with fair appetite; multiple dietary restrictions. She denies pain, shortness of breath, nausea, constipation. No recent falls reported. She is sleeping well.   Patient received sitting up to w/c. She is working on a word find puzzle. Pleasant mood; she does repeat herself during visit. Talks about growing up on farm in [place].   History obtained from review of EMR, discussion with primary team, and interview with family, facility staff/caregiver and/or Ms. [place].  I reviewed available labs, medications, imaging, studies and related documents from the EMR.  Records reviewed and summarized above.   ROS  Patient unable to contribute d/t her dementia.  Physical Exam: Pluse 78, resp 16, b/p 150/90. Sats 98% on room air Constitutional: NAD General: frail appearing, thin EYES: anicteric sclera, lids intact, no discharge  ENMT: intact hearing, oral  mucous membranes moist, dentition intact CV: S1S2, RRR, no LE edema Pulmonary: LCTA, no increased work of breathing, no cough, room air Abdomen: normo-active BS + 4 quadrants, soft and non tender GU: deferred MSK: no sarcopenia, moves all extremities, ambulatory Skin: warm and dry, no rashes or wounds on  visible skin Neuro: +generalized weakness,  A & O to person Psych: non-anxious affect, pleasant Hem/lymph/immuno: no widespread bruising   Thank you for the opportunity to participate in the care of Ms. Bulls.  The palliative care team will continue to follow. Please call our office at [phone] if we can be of additional assistance.   Ezekiel Slocumb, NP   COVID-19 PATIENT SCREENING TOOL Asked and negative response unless otherwise noted:   Have you had symptoms of covid, tested positive or been in contact with someone with symptoms/positive test in the past 5-10 days? No

## 2022-01-28 ENCOUNTER — Non-Acute Institutional Stay: Payer: Medicare Other | Admitting: Student

## 2022-01-28 DIAGNOSIS — R531 Weakness: Secondary | ICD-10-CM

## 2022-01-28 DIAGNOSIS — Z515 Encounter for palliative care: Secondary | ICD-10-CM

## 2022-01-28 DIAGNOSIS — F0150 Vascular dementia without behavioral disturbance: Secondary | ICD-10-CM

## 2022-01-28 DIAGNOSIS — E43 Unspecified severe protein-calorie malnutrition: Secondary | ICD-10-CM

## 2022-01-31 NOTE — Progress Notes (Signed)
[place] [facility] Consult Note Telephone: [phone]  Fax: [phone]    Date of encounter: 01/28/22 11:30AM PATIENT NAME: Gabrielle Navarro [address] # [facility] [address]   [phone] (home)  DOB: [birth year] MRN: [id number] PRIMARY CARE PROVIDER:    Merrily Brittle, NP   REFERRING PROVIDER:   Merrily Brittle, NP   RESPONSIBLE PARTY:    Contact Information     Name Relation Home Work Mobile   [place]   [phone]        I met face to face with patient in the facility. Palliative Care was asked to follow this patient by consultation request of  Merrily Brittle, NP  to address advance care planning and complex medical decision making. This is a follow up visit.                                   ASSESSMENT AND PLAN / RECOMMENDATIONS:   Advance Care Planning/Goals of Care: Goals include to maximize quality of life and symptom management. Patient/health care surrogate gave his/her permission to discuss. CODE STATUS: DNR  Education provided on Palliative Medicine vs. Hospice services. She was previously on hospice. She has completed therapy per staff. Will monitor for changes/declines and refer back to hospice when she meets criteria.   Symptom Management/Plan:  Mixed Alzheimer's and Vascular Dementia- patient resides at [facility] on memory unit. Requires assistance with adl's. Staff to assist with adl's, reorient and redirect as needed. Continue memantine as directed.    Generalized weakness- Staff to assist with adl's. Wheelchair for locomotion. Monitor for falls/safety.   Protein calorie malnutrition- staff is asked to reweigh d/t discrepancy. Weight on file shows almost 20 pound gain in 2 months. She has fair appetite; receives nutritional supplements/Mighty shakes TID. She has dietary restrictions; encourage foods she is able to enjoy, able to tolerate.   Follow up  Palliative Care Visit: Palliative care will continue to follow for complex medical decision making, advance care planning, and clarification of goals. Return in 6-8 weeks or prn.  This visit was coded based on medical decision making (MDM).  PPS: 30%  HOSPICE ELIGIBILITY/DIAGNOSIS: TBD  Chief Complaint: Palliative Medicine follow up visit.   HISTORY OF PRESENT ILLNESS:  Gabrielle Navarro is a 88 y.o. year old female  with Alzheimer's and vascular dementia, atrial fibrillation, hypertension, T2DM, depression, hypothyroidism, pseudogout, weight loss. Patient previously on hospice; daughter requested to start with palliative and then transition to hospice.   Patient currently resides at [facility] on locked memory unit. She has been stable per staff. She requires assistance with all adl's. She is out of bed daily to w/c; assist x 1 for transfers. She has moved rooms; staff report patient being more engaged since changing rooms. Patient with fair appetite; multiple dietary restrictions. Patient received in her room; she had just completed lunch. She is working on a crossword puzzle. She engages in conversation; she does repeat herself throughout visit. She denies pain, shortness of breath, nausea, constipation. No recent falls reported. No recent infections. She has completed therapy per staff. Reported weight of 134 pounds.    History obtained from review of EMR, discussion with primary team, and interview with family, facility staff/caregiver and/or [name].  I reviewed available labs, medications, imaging, studies and related documents from the EMR.  Records reviewed and summarized above.  ROS  Patient unable to contribute d/t her dementia.   Physical Exam: Constitutional: NAD General: frail appearing EYES: anicteric sclera, lids intact, no discharge  ENMT: intact hearing, oral mucous membranes moist, dentition intact CV: S1S2, RRR, no LE edema Pulmonary: LCTA, no increased work of  breathing, no cough, room air Abdomen: normo-active BS + 4 quadrants, soft and non tender, no ascites GU: deferred MSK: w/c for locomotion, non-ambulatory Skin: warm and dry, no rashes or wounds on visible skin Neuro: + generalized weakness, + cognitive impairment Psych: non-anxious affect, A and O to person Hem/lymph/immuno: no widespread bruising   Thank you for the opportunity to participate in the care of [name]. Please call our office at [phone] if we can be of additional assistance.   Ezekiel Slocumb, NP   COVID-19 PATIENT SCREENING TOOL Asked and negative response unless otherwise noted:   Have you had symptoms of covid, tested positive or been in contact with someone with symptoms/positive test in the past 5-10 days? No

## 2022-02-15 ENCOUNTER — Non-Acute Institutional Stay: Payer: Medicare Other | Admitting: Nurse Practitioner

## 2022-02-15 ENCOUNTER — Encounter: Payer: Self-pay | Admitting: Nurse Practitioner

## 2022-02-15 DIAGNOSIS — R531 Weakness: Secondary | ICD-10-CM

## 2022-02-15 DIAGNOSIS — Z515 Encounter for palliative care: Secondary | ICD-10-CM

## 2022-02-15 DIAGNOSIS — E43 Unspecified severe protein-calorie malnutrition: Secondary | ICD-10-CM

## 2022-02-15 DIAGNOSIS — F0280 Dementia in other diseases classified elsewhere without behavioral disturbance: Secondary | ICD-10-CM

## 2022-02-15 NOTE — Progress Notes (Signed)
[facility] Consult Note Telephone: [phone]  Fax: [phone]    Date of encounter: 02/15/22 9:19 PM PATIENT NAME: Gabrielle Navarro [address] # [place] [address]   [phone] (home)  DOB: 09/24/1927 MRN: [id number] PRIMARY CARE PROVIDER:    [facility] Locked Memory unit  RESPONSIBLE PARTY:    Contact Information     Name Relation Home Work Mobile   [place]   [phone]      I met face to face with patient in facility. Palliative Care was asked to follow this patient by consultation request of  Michail Sermon, MD to address advance care planning and complex medical decision making. This is a follow up visit.                                  ASSESSMENT AND PLAN / RECOMMENDATIONS:  Advance Care Planning/Goals of Care: Goals include to maximize quality of life and symptom management. Patient/health care surrogate gave his/her permission to discuss. CODE STATUS: DNR   Education provided on Palliative Medicine vs. Hospice services. She was previously on hospice. She has completed therapy per staff. Will monitor for changes/declines and refer back to hospice when she meets criteria.    Symptom Management/Plan: 1. Advance Care Planning;  DNR 2. Goals of Care: Goals include to maximize quality of life and symptom management. Our advance care planning conversation included a discussion about:    The value and importance of advance care planning  Exploration of personal, cultural or spiritual beliefs that might influence medical decisions  Exploration of goals of care in the event of a sudden injury or illness  Identification and preparation of a healthcare agent  Review and updating or creation of an advance directive document. 3. Palliative care encounter; Palliative care encounter; Palliative medicine team will continue to support patient, patient's family, and medical team. Visit consisted of  counseling and education dealing with the complex and emotionally intense issues of symptom management and palliative care in the setting of serious and potentially life-threatening illness   4. Mixed Alzheimer's and Vascular Dementia- ongoing progression, continue with supportive measures.    5. Generalized weakness- Debilitated. Assist with transfers, mobility, fall precautions. No recent falls.    6. Protein calorie malnutrition- reviewed weights, continue weights, current weight 134 lbs; encourage nutrition, snacks, supplements. Continue to assist when needed.    Follow up Palliative Care Visit: Palliative care will continue to follow for complex medical decision making, advance care planning, and clarification of goals. Return in 6-8 weeks or prn. PPS: 30%   Chief Complaint: Follow up palliative consult for complex medical decision making, address goals, manage ongoing symptoms  I spent 44 minutes providing this consultation. More than 50% of the time in this consultation was spent in counseling and care coordination.    HISTORY OF PRESENT ILLNESS:  Calleen Navarro is a 88 y.o. year old female  with Alzheimer's and vascular dementia, atrial fibrillation, hypertension, T2DM, depression, hypothyroidism, pseudogout, weight loss. Patient previously on hospice; I visited and observed Gabrielle Navarro currently residing at [facility], [place], locked memory care unit. Gabrielle. Ridge requires assistance with transfers, mobility, ADL's, tray setup with fair appetite. Current weight 134 lbs. Gabrielle. Navarro was engaging, cooperative, though visited limited with cognitive impairment. Support provided. Gabrielle. Navarro appears comfortable. ROS, medical goals, poc, medications reviewed. Will continue current plan, attempted to contact  daughter, updated staff, no new recommendations today.    History obtained from review of EMR, discussion with primary team, and interview with family, facility staff/caregiver and/or Gabrielle. [place].  I reviewed  available labs, medications, imaging, studies and related documents from the EMR.  Records reviewed and summarized above.    ROS Patient unable to contribute d/t her dementia.    Physical Exam: Constitutional: NAD General: frail appearing EYES: anicteric sclera, lids intact, no discharge  ENMT: intact hearing, oral mucous membranes moist, dentition intact CV: S1S2, RRR, no LE edema Pulmonary: LCTA, no increased work of breathing, no cough, room air Abdomen: normo-active BS + 4 quadrants, soft and non tender, no ascites GU: deferred MSK: w/c for locomotion, non-ambulatory Skin: warm and dry, no rashes or wounds on visible skin Neuro: + generalized weakness, + cognitive impairment Psych: non-anxious affect, A and O to person  Thank you for the opportunity to participate in the care of Gabrielle. Navarro. Please call our office at [phone] if we can be of additional assistance.   Gila Lauf Ihor Gully, NP

## 2022-04-11 ENCOUNTER — Encounter: Payer: Self-pay | Admitting: Nurse Practitioner

## 2022-04-11 ENCOUNTER — Non-Acute Institutional Stay: Payer: Medicare Other | Admitting: Nurse Practitioner

## 2022-04-11 DIAGNOSIS — Z515 Encounter for palliative care: Secondary | ICD-10-CM

## 2022-04-11 DIAGNOSIS — F0280 Dementia in other diseases classified elsewhere without behavioral disturbance: Secondary | ICD-10-CM

## 2022-04-11 NOTE — Progress Notes (Signed)
[facility] Consult Note Telephone: [phone]  Fax: [phone]    Date of encounter: 04/11/22 5:14 PM PATIENT NAME: Gabrielle Navarro [address] # [place] [address]   [phone] (home)  DOB: [birth year] MRN: [id number] PRIMARY CARE PROVIDER:    [facility] RESPONSIBLE PARTY:    Contact Information     Name Relation Home Work Mobile   [place]   [phone]     I met face to face with patient in facility. Palliative Care was asked to follow this patient by consultation request of  Michail Sermon, MD to address advance care planning and complex medical decision making. This is a follow up visit.                                  ASSESSMENT AND PLAN / RECOMMENDATIONS:  Symptom Management/Plan: 1. Advance Care Planning;  DNR 2. Goals of Care: Goals include to maximize quality of life and symptom management. Our advance care planning conversation included a discussion about:    The value and importance of advance care planning  Exploration of personal, cultural or spiritual beliefs that might influence medical decisions  Exploration of goals of care in the event of a sudden injury or illness  Identification and preparation of a healthcare agent  Review and updating or creation of an advance directive document. 3. Palliative care encounter; Palliative care encounter; Palliative medicine team will continue to support patient, patient's family, and medical team. Visit consisted of counseling and education dealing with the complex and emotionally intense issues of symptom management and palliative care in the setting of serious and potentially life-threatening illness   4. Mixed Alzheimer's and Vascular Dementia- ongoing progression, continue with supportive measures.    5. Generalized weakness- Debilitated. Assist with transfers, mobility, fall precautions. No recent falls.    6. Protein calorie  malnutrition- reviewed weights, continue weights, current weight 134 lbs; encourage nutrition, snacks, supplements. Continue to assist when needed.    Follow up Palliative Care Visit: Palliative care will continue to follow for complex medical decision making, advance care planning, and clarification of goals. Return in 6-8 weeks or prn. PPS: 30%   Chief Complaint: Follow up palliative consult for complex medical decision making, address goals, manage ongoing symptoms   I spent 44 minutes providing this consultation. More than 50% of the time in this consultation was spent in counseling and care coordination.    HISTORY OF PRESENT ILLNESS:  Gabrielle Navarro is a 89 y.o. year old female  with Alzheimer's and vascular dementia, atrial fibrillation, hypertension, T2DM, depression, hypothyroidism, pseudogout, weight loss. Patient previously on hospice; I visited and observed Gabrielle Navarro currently residing at [facility], [place], locked memory care unit. Gabrielle. Navarro requires assistance with transfers, mobility, ADL's, tray setup with fair appetite. Staff endorses no recent falls, wounds, hospitalizations, infections. Purpose of today PC f/u visit further discussion monitor trends of appetite, weights, monitor for functional, cognitive decline with chronic disease progression, assess any active symptoms, supportive role. At present Gabrielle Navarro is sitting in the w/c in her room, engaging, makes eye contact, appears comfortable. Gabrielle Navarro was engaging though difficulty leading through discussion, very focused on her bible. Attempted to talk about daily routine though limited with cognitive impairment. Gabrielle Navarro does reside in [place] [facility] at [facility]. We talked about ros, denies. Medications, goc, poc reviewed.  Attempted to contact Mardene Celeste, [facility] f/u visit further discussion monitor trends of appetite, weights, monitor for functional, cognitive decline with chronic disease progression, assess any active symptoms, supportive  role.    History obtained from review of EMR, discussion with primary team, and interview with family, facility staff/caregiver and/or Gabrielle Navarro.  I reviewed available labs, medications, imaging, studies and related documents from the EMR.  Records reviewed and summarized above.    Physical Exam: Constitutional: NAD General: frail appearing EYES: anicteric sclera, lids intact, no discharge  ENMT: intact hearing, oral mucous membranes moist, dentition intact CV: S1S2, RRR, no LE edema Pulmonary: LCTA, no increased work of breathing, no cough, room air Abdomen: normo-active BS + 4 quadrants, soft and non tender, no ascites GU: deferred MSK: w/c for locomotion, non-ambulatory Skin: warm and dry, no rashes or wounds on visible skin Neuro: + generalized weakness, + cognitive impairment Psych: non-anxious affect, A and O to person Thank you for the opportunity to participate in the care of Gabrielle Navarro. Please call our office at [phone] if we can be of additional assistance.   Kayden Amend Ihor Gully, NP

## 2022-06-09 ENCOUNTER — Non-Acute Institutional Stay: Payer: Medicare Other | Admitting: Nurse Practitioner

## 2022-06-09 ENCOUNTER — Encounter: Payer: Self-pay | Admitting: Nurse Practitioner

## 2022-06-09 DIAGNOSIS — E43 Unspecified severe protein-calorie malnutrition: Secondary | ICD-10-CM

## 2022-06-09 DIAGNOSIS — F0280 Dementia in other diseases classified elsewhere without behavioral disturbance: Secondary | ICD-10-CM

## 2022-06-09 DIAGNOSIS — Z515 Encounter for palliative care: Secondary | ICD-10-CM

## 2022-06-09 NOTE — Progress Notes (Signed)
[facility] Consult Note Telephone: [phone]  Fax: [phone]    Date of encounter: 06/09/22 4:49 PM PATIENT NAME: Gabrielle Navarro [address] [place] [address]   [phone] (home)  DOB: 08/15/1927 MRN: [id number] PRIMARY CARE PROVIDER:    [facility] Locked Memory Care Unit  RESPONSIBLE PARTY:    Contact Information     Name Relation Home Work Mobile   [place]   [phone]        I met face to face with patient in facility. Palliative Care was asked to follow this patient by consultation request of  Gabrielle Sermon, MD to address advance care planning and complex medical decision making. This is a follow up visit.                                  ASSESSMENT AND PLAN / RECOMMENDATIONS:  Symptom Management/Plan: 1. Advance Care Planning;  DNR 2. Palliative care encounter; Palliative care encounter; Palliative medicine team will continue to support patient, patient's family, and medical team. Visit consisted of counseling and education dealing with the complex and emotionally intense issues of symptom management and palliative care in the setting of serious and potentially life-threatening illness   3. 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  04/21/2022 weight 132 lbs 05/20/2022 weight 132 lbs 5. Generalized weakness- Debilitated. Assist with transfers, mobility, fall precautions. No recent falls.    4. Protein calorie malnutrition- reviewed weights, continue weights, current weight 134 lbs; encourage nutrition, snacks, supplements. Continue to assist when needed.    Follow up Palliative Care Visit: Palliative care will continue to follow for complex medical decision making, advance care planning, and clarification of goals. Return in 4-8 weeks or prn. PPS: 40%   Chief Complaint: Follow up palliative  consult for complex medical decision making, address goals, manage ongoing symptoms   I spent 41 minutes providing this consultation. More than 50% of the time in this consultation was spent in counseling and care coordination.    HISTORY OF PRESENT ILLNESS:  Gabrielle Navarro is a 89 y.o. year old female  with Alzheimer's and vascular dementia, atrial fibrillation, hypertension, T2DM, depression, hypothyroidism, pseudogout, weight loss. Patient previously on hospice; I visited and observed Gabrielle Navarro currently residing at [facility], [place], locked memory care unit. Gabrielle. Navarro requires assistance with transfers, mobility, ADL's, tray setup with fair appetite, feeds herself. Purpose of today PC f/u visit further discussion monitor trends of appetite, weights, monitor for functional, cognitive decline with chronic disease progression, assess any active symptoms, supportive role. At present Gabrielle Navarro is sitting in the dining room feeding herself lunch, Gabrielle Navarro expressed she did not like the potatoes. Gabrielle Navarro was engaging, made eye contact, able to answer simple questions, able to read name badge though repetitive questions, ruminating topics. Gabrielle Navarro was cooperative with assessment, able to talk about foods she likes. Discussed with staff, no knew functional or cognitive changes, stable. No recent falls, wounds, infections, hospitalizations. Gabrielle Navarro does reside in [place] [facility] at [facility]. We talked about ros, denies. Medications, goc, poc reviewed. Attempted to contact Gabrielle Navarro, guardian PC f/u visit further discussion monitor trends of appetite, weights, monitor for functional, cognitive decline with chronic disease progression, assess any active symptoms, supportive role.    History obtained from review of EMR, discussion with primary team, and  interview with family, facility staff/caregiver and/or Gabrielle Navarro.  I reviewed available labs, medications, imaging, studies and related documents from the EMR.  Records  reviewed and summarized above.    Physical Exam: General: frail appearing, elderly pleasant female, engaging ENMT: oral mucous membranes moist CV: S1S2, RRR Pulmonary: LCTA Neuro: + generalized weakness, + cognitive impairment Psych: non-anxious affect, A and O to person  Thank you for the opportunity to participate in the care of Gabrielle Navarro. Please call our office at [phone] if we can be of additional assistance.   Gabrielle Belson Ihor Gully, NP

## 2022-07-20 ENCOUNTER — Non-Acute Institutional Stay: Payer: Medicare Other | Admitting: Nurse Practitioner

## 2022-07-20 ENCOUNTER — Encounter: Payer: Self-pay | Admitting: Nurse Practitioner

## 2022-07-20 DIAGNOSIS — Z515 Encounter for palliative care: Secondary | ICD-10-CM

## 2022-07-20 DIAGNOSIS — F0280 Dementia in other diseases classified elsewhere without behavioral disturbance: Secondary | ICD-10-CM

## 2022-07-20 DIAGNOSIS — E43 Unspecified severe protein-calorie malnutrition: Secondary | ICD-10-CM

## 2022-07-20 NOTE — Progress Notes (Signed)
[facility] Consult Note Telephone: [phone]  Fax: [phone]    Date of encounter: 07/20/22 3:38 PM PATIENT NAME: Gabrielle Navarro [address] [place] [address]   [phone] (home)  DOB: 07-20-1927 MRN: [id number] PRIMARY CARE PROVIDER:    [facility]; Locked Memory Care Unit  RESPONSIBLE PARTY:    Contact Information     Name Relation Home Work Mobile   Gabrielle Navarro [phone]   [phone]     I met face to face with patient in facility. Palliative Care was asked to follow this patient by consultation request of  Gabrielle Hobby, MD to address advance care planning and complex medical decision making. This is a follow up visit.                                  ASSESSMENT AND PLAN / RECOMMENDATIONS:  Symptom Management/Plan: 1. Advance Care Planning;  DNR 2. Palliative care encounter; Palliative care encounter; Palliative medicine team will continue to support patient, patient's family, and medical team. Visit consisted of counseling and education dealing with the complex and emotionally intense issues of symptom management and palliative care in the setting of serious and potentially life-threatening illness   3. 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    4. Protein calorie malnutrition- reviewed weights, continue weights; encourage nutrition, snacks, supplements. Continue to assist when needed.  06/09/2022 weight 134 lbs 07/20/2022 weight 133.2 lbs  Follow up Palliative Care Visit: Palliative care will continue to follow for complex medical decision making, advance care planning, and clarification of goals. Return in 2-8 weeks or prn. PPS: 40%   Chief Complaint: Follow up palliative consult for complex medical decision making, address goals, manage ongoing symptoms   I spent 45 minutes providing this consultation.  More than 50% of the time in this consultation was spent in counseling and care coordination.    HISTORY OF PRESENT ILLNESS:  Gabrielle Navarro is a 89 y.o. year old female  with Alzheimer's and vascular dementia, atrial fibrillation, hypertension, T2DM, depression, hypothyroidism, pseudogout, weight loss. Patient previously on hospice; I visited and observed Gabrielle Navarro currently residing at [facility], [place], locked memory care unit. Gabrielle Navarro requires assistance with transfers, mobility, ADL's, tray setup with fair appetite, feeds herself. Purpose of today PC f/u visit further discussion monitor trends of appetite, weights, monitor for functional, cognitive decline with chronic disease progression, assess any active symptoms, supportive role. At present Gabrielle Navarro is sitting in the dining room feeding herself lunch, saying words, engaging, making eye contact. Gabrielle Navarro was hard to keep focused on discussion, ros, supportive visit, no new falls, wounds, infections, hospitalizations. Reviewed weights, provider/nursing notes;  Medications, goc, poc reviewed. Attempted to contact Gabrielle Navarro, guardian PC f/u visit further discussion monitor trends of appetite, weights, monitor for functional, cognitive decline with chronic disease progression, assess any active symptoms, supportive role. Currently Gabrielle Navarro is stable. Updated staff.   History obtained from review of EMR, discussion with primary team, and interview with family, facility staff/caregiver and/or Gabrielle Navarro.  I reviewed available labs, medications, imaging, studies and related documents from the EMR.  Records reviewed and summarized above.    Physical Exam: General: frail appearing, elderly pleasant female, engaging ENMT: oral mucous membranes moist CV: S1S2, RRR Pulmonary: LCTA Neuro: + generalized weakness, + cognitive impairment Psych: non-anxious affect, Alert, engaging, oriented to person  Thank you for the opportunity to participate in the care of Gabrielle Navarro. Please  call our office at [phone] if we can be of additional assistance.   Gabrielle Navarro Gabrielle Rome, NP

## 2022-08-30 ENCOUNTER — Encounter: Payer: Self-pay | Admitting: Nurse Practitioner

## 2022-08-30 ENCOUNTER — Non-Acute Institutional Stay: Payer: Medicare Other | Admitting: Nurse Practitioner

## 2022-08-30 DIAGNOSIS — Z515 Encounter for palliative care: Secondary | ICD-10-CM

## 2022-08-30 DIAGNOSIS — R634 Abnormal weight loss: Secondary | ICD-10-CM

## 2022-08-30 DIAGNOSIS — E43 Unspecified severe protein-calorie malnutrition: Secondary | ICD-10-CM

## 2022-08-30 DIAGNOSIS — F0150 Vascular dementia without behavioral disturbance: Secondary | ICD-10-CM

## 2022-08-30 NOTE — Progress Notes (Signed)
Therapist, nutritional Palliative Care Consult Note Telephone: [phone]  Fax: [phone]    Date of encounter: 08/30/22 4:54 PM PATIENT NAME: Gabrielle Navarro [address] [place] [address]   [phone] (home)  DOB: [birth year] MRN: [id number] PRIMARY CARE PROVIDER:    [facility] locked memory care unit  RESPONSIBLE PARTY:    Contact Information     Name Relation Home Work Mobile   Gabrielle Navarro [phone]   [phone]        I met face to face with patient in facility. Palliative Care was asked to follow this patient by consultation request of  Laverle Hobby, MD to address advance care planning and complex medical decision making. This is a follow up visit.                                  ASSESSMENT AND PLAN / RECOMMENDATIONS:  Symptom Management/Plan: 1. Advance Care Planning;  DNR 2. Palliative care encounter; Palliative care encounter; Palliative medicine team will continue to support patient, patient's family, and medical team. Visit consisted of counseling and education dealing with the complex and emotionally intense issues of symptom management and palliative care in the setting of serious and potentially life-threatening illness   3. 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    4. Protein calorie malnutrition- reviewed weights, continue weights; encourage nutrition, snacks, supplements. Continue to assist when needed.  06/09/2022 weight 134 lbs 07/20/2022 weight 133.2 lbs  08/20/2022 weight 127.8 lbs Follow up Palliative Care Visit: Palliative care will continue to follow for complex medical decision making, advance care planning, and clarification of goals. Return in 2-8 weeks or prn. PPS: 40%   Chief Complaint: Follow up palliative consult for complex medical decision making, address goals, manage ongoing symptoms   I spent 42 minutes  providing this consultation. More than 50% of the time in this consultation was spent in counseling and care coordination.    HISTORY OF PRESENT ILLNESS:  Gabrielle Navarro is a 89 y.o. year old female  with Alzheimer's and vascular dementia, atrial fibrillation, hypertension, T2DM, depression, hypothyroidism, pseudogout, weight loss. Patient previously on hospice; I visited and observed Gabrielle Navarro currently residing at [facility], [place], locked memory care unit. Gabrielle. Navarro requires assistance with transfers, mobility, ADL's, tray setup with fair appetite, feeds herself. Purpose of today PC f/u visit further discussion monitor trends of appetite, weights, monitor for functional, cognitive decline with chronic disease progression, assess any active symptoms, supportive role. At present Gabrielle Navarro is sitting in the dining room feeding herself lunch.  Reviewed weights, provider/nursing notes;  Medications, goc, poc reviewed. Attempted to contact Elease Hashimoto, guardian PC f/u visit further discussion monitor trends of appetite, weights, monitor for functional, cognitive decline with chronic disease progression, assess any active symptoms, supportive role. Currently Gabrielle Navarro is stable. Updated staff.   History obtained from review of EMR, discussion with primary team, and interview with family, facility staff/caregiver and/or Gabrielle. Navarro.  I reviewed available labs, medications, imaging, studies and related documents from the EMR.  Records reviewed and summarized above.    Physical Exam: General: frail appearing, elderly pleasant female, engaging ENMT: oral mucous membranes moist CV: S1S2, RRR Pulmonary: LCTA Neuro: + generalized weakness, + cognitive impairment Psych: non-anxious affect, Alert, engaging, oriented to person Thank you for the opportunity to participate in the care of Gabrielle Navarro. Please call our  office at [phone] if we can be of additional assistance.   Joory Gough [place], NP

## 2023-02-09 ENCOUNTER — Encounter: Payer: Self-pay | Admitting: Emergency Medicine

## 2023-02-09 ENCOUNTER — Other Ambulatory Visit: Payer: Self-pay

## 2023-02-09 ENCOUNTER — Emergency Department: Payer: Medicare Other

## 2023-02-09 ENCOUNTER — Emergency Department
Admission: EM | Admit: 2023-02-09 | Discharge: 2023-02-09 | Disposition: A | Discharge status: Home / Self Care | Payer: Medicare Other | Attending: Emergency Medicine | Admitting: Emergency Medicine

## 2023-02-09 DIAGNOSIS — Z20822 Contact with and (suspected) exposure to covid-19: Secondary | ICD-10-CM | POA: Insufficient documentation

## 2023-02-09 DIAGNOSIS — B9789 Other viral agents as the cause of diseases classified elsewhere: Secondary | ICD-10-CM | POA: Diagnosis not present

## 2023-02-09 DIAGNOSIS — J069 Acute upper respiratory infection, unspecified: Secondary | ICD-10-CM | POA: Insufficient documentation

## 2023-02-09 DIAGNOSIS — R059 Cough, unspecified: Secondary | ICD-10-CM | POA: Diagnosis present

## 2023-02-09 LAB — RESP PANEL BY RT-PCR (RSV, FLU A&B, COVID)  RVPGX2
Influenza A by PCR: NEGATIVE
Influenza B by PCR: NEGATIVE
Resp Syncytial Virus by PCR: NEGATIVE
SARS Coronavirus 2 by RT PCR: NEGATIVE

## 2023-02-09 NOTE — ED Triage Notes (Addendum)
Pt arrives via [facility] from [facility] with c/o cough for 1/wk. Per family pt has been given Robitussin at the facility with no improvement. Pt denies any complaints. Pt has a hx of dementia. Pt is A&Ox2.

## 2023-02-09 NOTE — ED Provider Notes (Signed)
   [facility] Provider Note    Event Date/Time   First MD Initiated Contact with Patient 02/09/23 [phone]     (approximate)   History   Cough   HPI  Gabrielle Navarro is a 89 y.o. female who presents with daughter from facility for cough.  Daughter reports patient has had a dry cough for about a week, no reports of shortness of breath or fever.  Apparently many people at the facility have a cough.  Daughter decided to bring her in to rule out walking pneumonia     Physical Exam   Triage Vital Signs: ED Triage Vitals  Encounter Vitals Group     BP 02/09/23 0816 (!) 126/95     Systolic BP Percentile --      Diastolic BP Percentile --      Pulse Rate 02/09/23 0816 79     Resp 02/09/23 0816 18     Temp 02/09/23 0816 (!) 97.5 F (36.4 C)     Temp Source 02/09/23 0816 Oral     SpO2 02/09/23 0816 98 %     Weight 02/09/23 0821 61.5 kg (135 lb 8 oz)     Height 02/09/23 0821 1.651 m (5\' 5" )     Head Circumference --      Peak Flow --      Pain Score --      Pain Loc --      Pain Education --      Exclude from Growth Chart --     Most recent vital signs: Vitals:   02/09/23 0816  BP: (!) 126/95  Pulse: 79  Resp: 18  Temp: (!) 97.5 F (36.4 C)  SpO2: 98%     General: Awake, no distress.  Hard of hearing, overall quite well-appearing CV:  Good peripheral perfusion.  Resp:  Normal effort.  Tachypnea clear to auscultation, no wheezing Abd:  No distention.  Other:     ED Results / Procedures / Treatments   Labs (all labs ordered are listed, but only abnormal results are displayed) Labs Reviewed  RESP PANEL BY RT-PCR (RSV, FLU A&B, COVID)  [id number]     EKG     RADIOLOGY Chest x-ray viewed interpret by me no acute abnormality    PROCEDURES:  Critical Care performed:   Procedures   MEDICATIONS ORDERED IN ED: Medications - No data to display   IMPRESSION / MDM / ASSESSMENT AND PLAN / ED COURSE  I reviewed the triage vital signs and  the nursing notes. Patient's presentation is most consistent with acute complicated illness / injury requiring diagnostic workup.  Patient presents with cough, she is afebrile, well-appearing without tachycardia.  Discussed with daughter who agrees no blood work necessary at this time, will send COVID RSV flu, obtain chest x-ray and reevaluate  Chest x-ray is reassuring, patient remains well-appearing, no indication for admission at this time, appropriate for discharge, daughter agrees with this plan      FINAL CLINICAL IMPRESSION(S) / ED DIAGNOSES   Final diagnoses:  Viral upper respiratory tract infection     Rx / DC Orders   ED Discharge Orders     None        Note:  This document was prepared using Dragon voice recognition software and may include unintentional dictation errors.   Jene Every, MD 02/09/23 1054

## 2023-11-20 DEATH — deceased

## 2023-12-11 IMAGING — DX DG KNEE 1-2V PORT*L*
4 series · 4 of 4 positions shown · non-contrast
Comparison: None.

CLINICAL DATA: Left knee pain, no known injury, initial encounter

EXAM:
PORTABLE LEFT KNEE - 4 VIEW

[knee ap]
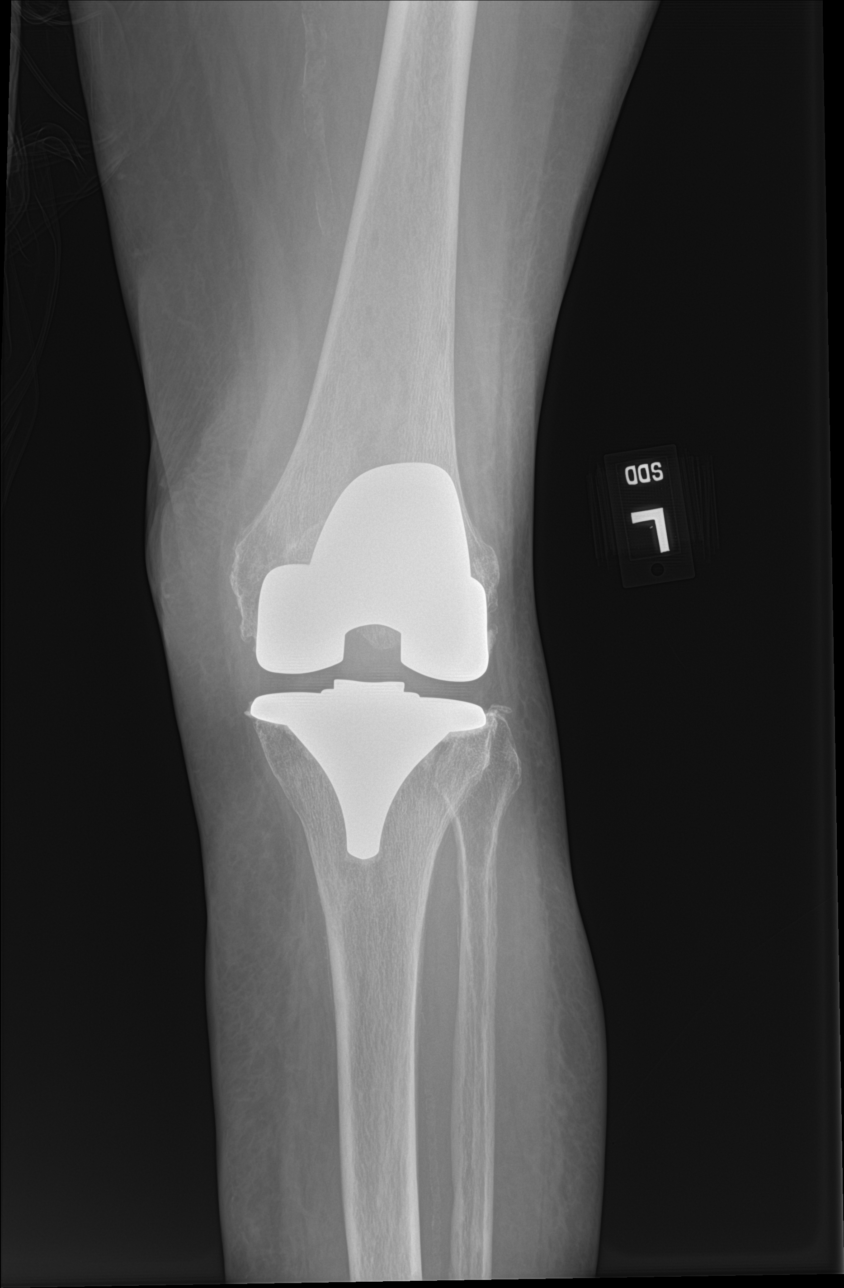

[knee lat]
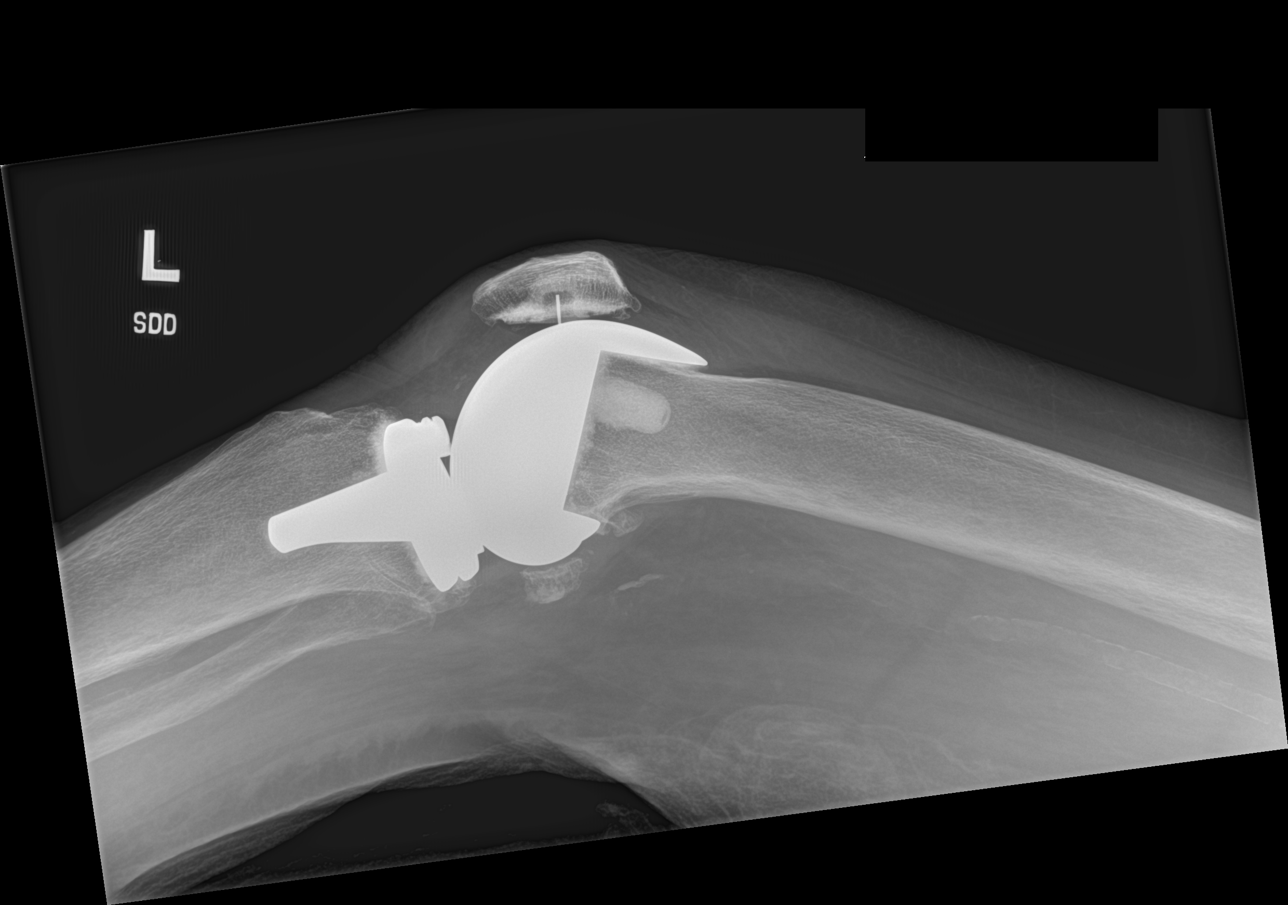

[knee obl (1 of 2)]
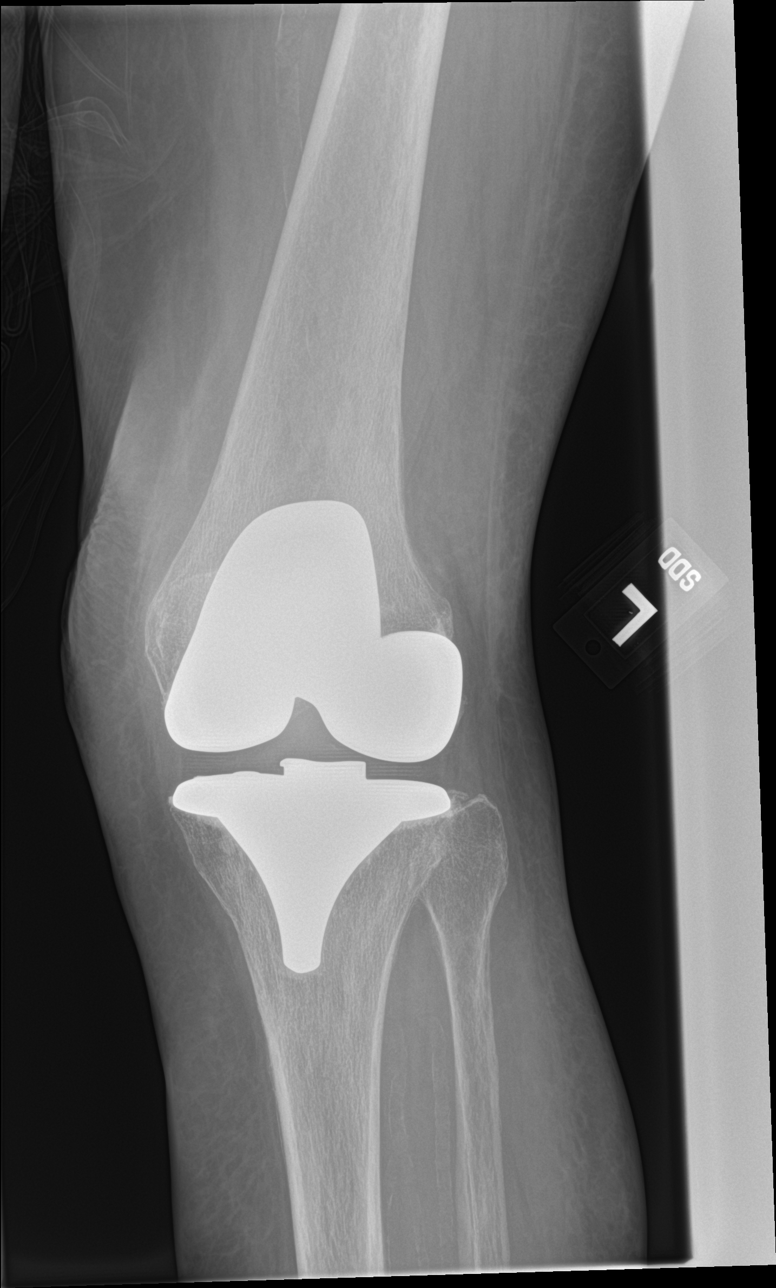

[knee obl (2 of 2)]
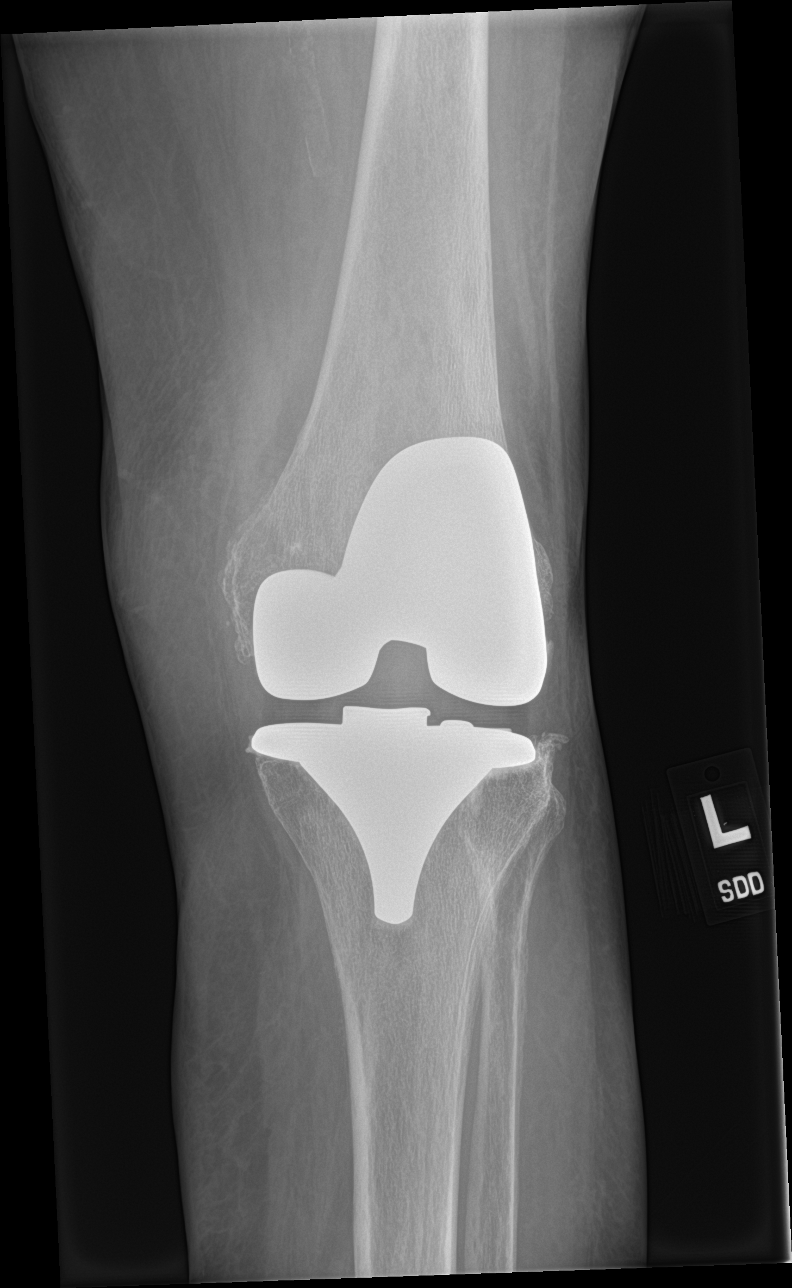

[4 of 4 positions shown; findings below may reference images not displayed]

FINDINGS: Left knee prosthesis is noted. No acute fracture or dislocation is
noted. No joint effusion is seen. No acute soft tissue changes are
seen.
IMPRESSION: No acute abnormality noted.
